# Patient Record
Sex: Female | Born: 1955 | Race: White | Hispanic: No | Marital: Married | Smoking: Never smoker
Health system: Southern US, Community
[De-identification: ages and names within clinical notes are randomized; demographics above are authoritative.]

## PROBLEM LIST (undated history)

## (undated) DIAGNOSIS — N289 Disorder of kidney and ureter, unspecified: Secondary | ICD-10-CM

## (undated) DIAGNOSIS — J3489 Other specified disorders of nose and nasal sinuses: Secondary | ICD-10-CM

---

## 1998-06-30 ENCOUNTER — Other Ambulatory Visit: Admission: RE | Admit: 1998-06-30 | Discharge: 1998-06-30 | Payer: Self-pay

## 1998-11-23 ENCOUNTER — Ambulatory Visit (HOSPITAL_COMMUNITY): Admission: RE | Admit: 1998-11-23 | Discharge: 1998-11-23 | Payer: Self-pay | Admitting: Urology

## 1998-11-23 ENCOUNTER — Emergency Department (HOSPITAL_COMMUNITY): Admission: EM | Admit: 1998-11-23 | Discharge: 1998-11-23 | Payer: Self-pay | Admitting: Emergency Medicine

## 1999-07-30 ENCOUNTER — Other Ambulatory Visit: Admission: RE | Admit: 1999-07-30 | Discharge: 1999-07-30 | Payer: Self-pay | Admitting: Obstetrics and Gynecology

## 2000-06-24 ENCOUNTER — Other Ambulatory Visit: Admission: RE | Admit: 2000-06-24 | Discharge: 2000-06-24 | Payer: Self-pay | Admitting: Obstetrics and Gynecology

## 2001-08-21 ENCOUNTER — Other Ambulatory Visit: Admission: RE | Admit: 2001-08-21 | Discharge: 2001-08-21 | Payer: Self-pay | Admitting: Family Medicine

## 2002-11-15 ENCOUNTER — Ambulatory Visit (HOSPITAL_BASED_OUTPATIENT_CLINIC_OR_DEPARTMENT_OTHER): Admission: RE | Admit: 2002-11-15 | Discharge: 2002-11-15 | Payer: Self-pay | Admitting: Urology

## 2002-11-15 ENCOUNTER — Encounter: Payer: Self-pay | Admitting: Urology

## 2002-12-15 ENCOUNTER — Other Ambulatory Visit: Admission: RE | Admit: 2002-12-15 | Discharge: 2002-12-15 | Payer: Self-pay | Admitting: Family Medicine

## 2003-12-23 ENCOUNTER — Other Ambulatory Visit: Admission: RE | Admit: 2003-12-23 | Discharge: 2003-12-23 | Payer: Self-pay | Admitting: Family Medicine

## 2004-12-25 ENCOUNTER — Other Ambulatory Visit: Admission: RE | Admit: 2004-12-25 | Discharge: 2004-12-25 | Payer: Self-pay | Admitting: Family Medicine

## 2006-01-03 ENCOUNTER — Other Ambulatory Visit: Admission: RE | Admit: 2006-01-03 | Discharge: 2006-01-03 | Payer: Self-pay | Admitting: Family Medicine

## 2007-01-06 ENCOUNTER — Other Ambulatory Visit: Admission: RE | Admit: 2007-01-06 | Discharge: 2007-01-06 | Payer: Self-pay | Admitting: Family Medicine

## 2008-01-15 ENCOUNTER — Other Ambulatory Visit: Admission: RE | Admit: 2008-01-15 | Discharge: 2008-01-15 | Payer: Self-pay | Admitting: Family Medicine

## 2008-02-15 ENCOUNTER — Ambulatory Visit (HOSPITAL_COMMUNITY): Admission: RE | Admit: 2008-02-15 | Discharge: 2008-02-15 | Payer: Self-pay | Admitting: Urology

## 2009-05-01 ENCOUNTER — Encounter: Admission: RE | Admit: 2009-05-01 | Discharge: 2009-05-01 | Payer: Self-pay | Admitting: Obstetrics and Gynecology

## 2010-02-16 ENCOUNTER — Encounter: Payer: Self-pay | Admitting: Pulmonary Disease

## 2010-02-16 ENCOUNTER — Ambulatory Visit: Payer: Self-pay | Admitting: Critical Care Medicine

## 2010-02-16 ENCOUNTER — Telehealth (INDEPENDENT_AMBULATORY_CARE_PROVIDER_SITE_OTHER): Payer: Self-pay | Admitting: *Deleted

## 2010-02-16 DIAGNOSIS — R0609 Other forms of dyspnea: Secondary | ICD-10-CM

## 2010-02-16 DIAGNOSIS — J301 Allergic rhinitis due to pollen: Secondary | ICD-10-CM | POA: Insufficient documentation

## 2010-02-16 DIAGNOSIS — R0989 Other specified symptoms and signs involving the circulatory and respiratory systems: Secondary | ICD-10-CM | POA: Insufficient documentation

## 2010-03-02 ENCOUNTER — Ambulatory Visit: Payer: Self-pay | Admitting: Critical Care Medicine

## 2010-03-05 ENCOUNTER — Encounter: Payer: Self-pay | Admitting: Critical Care Medicine

## 2010-03-06 ENCOUNTER — Telehealth: Payer: Self-pay | Admitting: Critical Care Medicine

## 2010-04-13 ENCOUNTER — Telehealth: Payer: Self-pay | Admitting: Critical Care Medicine

## 2010-04-20 ENCOUNTER — Ambulatory Visit: Payer: Self-pay | Admitting: Critical Care Medicine

## 2010-04-20 ENCOUNTER — Telehealth: Payer: Self-pay | Admitting: Critical Care Medicine

## 2010-04-23 ENCOUNTER — Ambulatory Visit: Payer: Self-pay | Admitting: Adult Health

## 2010-04-24 ENCOUNTER — Ambulatory Visit (HOSPITAL_COMMUNITY): Admission: RE | Admit: 2010-04-24 | Discharge: 2010-04-24 | Payer: Self-pay | Admitting: Critical Care Medicine

## 2010-04-24 ENCOUNTER — Encounter: Payer: Self-pay | Admitting: Critical Care Medicine

## 2010-04-24 ENCOUNTER — Telehealth (INDEPENDENT_AMBULATORY_CARE_PROVIDER_SITE_OTHER): Payer: Self-pay | Admitting: *Deleted

## 2010-04-24 LAB — CONVERTED CEMR LAB
BUN: 20 mg/dL (ref 6–23)
Basophils Absolute: 0 10*3/uL (ref 0.0–0.1)
Basophils Relative: 0.8 % (ref 0.0–3.0)
CO2: 31 meq/L (ref 19–32)
Calcium: 9.6 mg/dL (ref 8.4–10.5)
Chloride: 102 meq/L (ref 96–112)
Creatinine, Ser: 0.8 mg/dL (ref 0.4–1.2)
Eosinophils Absolute: 0.1 10*3/uL (ref 0.0–0.7)
Eosinophils Relative: 2.5 % (ref 0.0–5.0)
GFR calc non Af Amer: 85.24 mL/min (ref >60)
Glucose, Bld: 93 mg/dL (ref 70–99)
HCT: 40 % (ref 36.0–46.0)
Hemoglobin: 14 g/dL (ref 12.0–15.0)
Lymphocytes Relative: 22.6 % (ref 12.0–46.0)
Lymphs Abs: 1.3 10*3/uL (ref 0.7–4.0)
MCHC: 35 g/dL (ref 30.0–36.0)
MCV: 94.4 fL (ref 78.0–100.0)
Monocytes Absolute: 0.7 10*3/uL (ref 0.1–1.0)
Monocytes Relative: 11.5 % (ref 3.0–12.0)
Neutro Abs: 3.6 10*3/uL (ref 1.4–7.7)
Neutrophils Relative %: 62.6 % (ref 43.0–77.0)
Platelets: 219 10*3/uL (ref 150.0–400.0)
Potassium: 4.4 meq/L (ref 3.5–5.1)
Pro B Natriuretic peptide (BNP): 48.2 pg/mL (ref 0.0–100.0)
RBC: 4.23 M/uL (ref 3.87–5.11)
RDW: 12.6 % (ref 11.5–14.6)
Sodium: 140 meq/L (ref 135–145)
WBC: 5.7 10*3/uL (ref 4.5–10.5)

## 2010-04-27 ENCOUNTER — Ambulatory Visit: Payer: Self-pay | Admitting: Internal Medicine

## 2010-05-01 ENCOUNTER — Telehealth (INDEPENDENT_AMBULATORY_CARE_PROVIDER_SITE_OTHER): Payer: Self-pay | Admitting: *Deleted

## 2010-05-01 ENCOUNTER — Ambulatory Visit: Payer: Self-pay | Admitting: Critical Care Medicine

## 2010-05-01 DIAGNOSIS — J018 Other acute sinusitis: Secondary | ICD-10-CM | POA: Insufficient documentation

## 2010-05-02 ENCOUNTER — Encounter: Payer: Self-pay | Admitting: Critical Care Medicine

## 2010-05-14 ENCOUNTER — Telehealth (INDEPENDENT_AMBULATORY_CARE_PROVIDER_SITE_OTHER): Payer: Self-pay | Admitting: *Deleted

## 2010-06-16 ENCOUNTER — Emergency Department (HOSPITAL_COMMUNITY): Admission: EM | Admit: 2010-06-16 | Discharge: 2010-06-16 | Payer: Self-pay | Admitting: Emergency Medicine

## 2010-08-08 ENCOUNTER — Encounter: Payer: Self-pay | Admitting: Critical Care Medicine

## 2010-12-31 ENCOUNTER — Encounter: Payer: Self-pay | Admitting: Obstetrics and Gynecology

## 2011-01-08 NOTE — Assessment & Plan Note (Signed)
Summary: Acute NP office visit - dyspnea   Copy to:  Dr. Marcelle Overlie Primary Provider/Referring Provider:  Dr. Marcelle Overlie  CC:  increased SOB, wheezing, dry cough, tightness in chest x1week, was given pred taper but symptoms have not improved.  states symptoms began shortly after having dental work, and and c/o some pain in right side of chest at breast area ?d/t weight lifting.Vanessa Diaz  History of Present Illness:  55  year old female who presented for initial pulmonary consult for dyspnea on 02/16/10  02/16/10--The patient complains of shortness of breath, but denies history of diagnosed COPD, chest tightness, chest pain worse with breathing and coughing, wheezing, cough, mucous production, nocturnal awakening, exercise induced symptoms, and congestion.  The cough is described as insignificant.  The dyspnea is described as dyspnea with exertion.  .  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  If the pt inhales the pt feels she cannot enough air.  There is occ cough dry.  No real  wheeze.  No dx of asthma.  Pt had sinus surgery 12/10.  Pt smoked minimally and quit 1998.  There was passive smoke exporsure as a child.    04/20/10--Returns for persistent symptoms of dyspnea. She complains of increased SOB, wheezing, dry cough, tightness in chest for 1 week.  She was called in pred taper 1 week ago, now fnishted w/o significant improvment. Symptoms began shortly after having dental work, and c/o some pain in right side of chest at breast area ?d/t weight lifting. She has had some nasal congestion, drainage and stuffiness. On average she exercises at moderate levels 4-5 x week, spin/eights, and treadmil. 30 min cardio. can still exercise, main trouble is w/ incline. She does not feel she can take in good deep breath. Last visit was given trial of Spiriva. Since last visit she did improve and returned to her baseline acitivities and exercise schedule until  2 weeks (right after her dental work). She denies exertional chest pain, jaw pain, presyncope/syncope, calf pain/swelling or redness or orthopnea. Prev PFT this year showed FEV1 at 116% and ratio of 75. She is undergoing CPST next week. 6 min walk w/ no desaturations and  HR in 50-60s.     Medications Prior to Update: 1)  Biotin 5000 Mcg Caps (Biotin) .... Once Daily 2)  Vitamin D3 2000 Unit Caps (Cholecalciferol) .... Once Daily 3)  Fish Oil 1000 Mg Caps (Omega-3 Fatty Acids) .... Once Daily 4)  Multivitamins  Tabs (Multiple Vitamin) .... Once Daily 5)  Glucosamine 1500 Complex  Caps (Glucosamine-Chondroit-Vit C-Mn) .... Once Daily 6)  Calcium 600 1500 Mg Tabs (Calcium Carbonate) .... Two Times A Day 7)  Progesterone 2% Cream .... At Bedtime 8)  Estriol 0.25 % .Vanessa Diaz.. 1ml Three Times Weekly 9)  Spiriva Handihaler 18 Mcg  Caps (Tiotropium Bromide Monohydrate) .... Two Puffs in Handihaler Daily 10)  Prednisone 10 Mg Tabs (Prednisone) .... Take 4 Tabs  X 2 Days, 3 Tabs X 2 Tabs Days, 2 Tabs  X 2 Days, 1 Tab  X 2 Days Then Stop  Current Medications (verified): 1)  Biotin 5000 Mcg Caps (Biotin) .... Once Daily 2)  Vitamin D3 2000 Unit Caps (Cholecalciferol) .... Once Daily 3)  Fish Oil 1000 Mg Caps (Omega-3 Fatty Acids) .... Once Daily 4)  Multivitamins  Tabs (Multiple Vitamin) .... Once Daily 5)  Glucosamine 1500 Complex  Caps (Glucosamine-Chondroit-Vit C-Mn) .... Once Daily 6)  Calcium  600 1500 Mg Tabs (Calcium Carbonate) .... Two Times A Day 7)  Progesterone 2% Cream .... At Bedtime 8)  Estriol 0.25 % .Vanessa Diaz.. 1ml Three Times Weekly 9)  Spiriva Handihaler 18 Mcg  Caps (Tiotropium Bromide Monohydrate) .... Two Puffs in Handihaler Daily 10)  Amoxicillin 500 Mg Caps (Amoxicillin) .... 2 Intially Then 1 Every 6 Hours Till Gone As Directed By Dentist - Will Finished 04-21-10 11)  Testosterone Subl 0.25mg  .... 3 Times A Week As Directed By Dr. Vincente Poli  Allergies (verified): No Known Drug  Allergies  Past History:  Past Surgical History: Last updated: 02/16/2010 sinus surgery 11/2010  Family History: Last updated: 02/16/2010 father-lymphoma brother with asthma  Social History: Last updated: 02/16/2010 Patient states former smoker.  Quit in 1998 <1ppd x9 years. 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  --CPST>> --nml cbc, bmet, bnp 04/20/10    Vital Signs:  Patient profile:   55 year old female Height:      68 inches Weight:      138 pounds BMI:     21.06 O2 Sat:      98 % on Room air Temp:     97.9 degrees F oral Pulse rate:   67 / minute BP sitting:   122 / 84  (left arm) Cuff size:   regular  Vitals Entered By: Boone Master CNA (Apr 20, 2010 10:54 AM)  O2 Flow:  Room air CC: increased SOB, wheezing, dry cough, tightness in chest x1week, was given pred taper but symptoms have not improved.  states symptoms began shortly after having dental work, and c/o some pain in right side of chest at breast area ?d/t weight lifting. Is Patient Diabetic? No Comments Medications reviewed with patient Daytime contact number verified with patient. Boone Master CNA  Apr 20, 2010 10:56 AM    Physical Exam  Additional Exam:  Gen: Pleasant, well-nourished, in no distress,  normal affect ENT: No lesions,  mouth clear,  oropharynx clear, NM red/swollen, clear discharge  Neck: No JVD, no TMG, no carotid bruits Lungs: No use of accessory muscles, no dullness to percussion, clear without rales or rhonchi, distant BS Cardiovascular: RRR, heart sounds normal, no murmur or gallops, no peripheral edema Abdomen: soft and NT, no HSM,   BS normal Musculoskeletal: No deformities, no cyanosis or clubbing Neuro: alert, non focal Skin: Warm, no lesions or rashes    Impression & Recommendations:  Problem # 1:  DYSPNEA ON EXERTION (ICD-786.09) Main complaint is she has flare of dyspnea w/ incline. She is quite active w/ moderate exercise capacity at baseline.  PFTs showed nml lung fxn. No desaturations w/ 6 min walk and good tolearnce/nml heartrate labs are unrevealing w/ nml BNP, CBC . CXR showed mild hyperinflation.  will look at CPST when available.  ? etiology, may have component exercise induced asthma  Initially symptoms flared after sinusiits, and now has mild rhinitis flare (no sign of active infection) For now will tx rhinitis flare and follow up on CPST. Hold oil based capsules to prevent upper airway irritation.   She has follow up in 2 weeks w/ Dr. Delford Field  Will discuss case further with him.  Her updated medication  list for this problem includes:    Spiriva Handihaler 18 Mcg Caps (Tiotropium bromide monohydrate) .Vanessa Diaz..Vanessa Diaz Two puffs in handihaler daily  Orders: TLB-CBC Platelet - w/Differential (85025-CBCD) TLB-BMP (Basic Metabolic Panel-BMET) (80048-METABOL) TLB-BNP (B-Natriuretic Peptide) (83880-BNPR) Est. Patient Level IV (81191)  Medications Added to Medication List This Visit: 1)  Amoxicillin 500 Mg Caps (Amoxicillin) .... 2 intially then 1 every 6 hours till gone as directed by dentist - will finished 04-21-10 2)  Testosterone Subl 0.25mg   .... 3 times a week as directed by dr. Vincente Poli  Complete Medication List: 1)  Biotin 5000 Mcg Caps (Biotin) .... Once daily 2)  Vitamin D3 2000 Unit Caps (Cholecalciferol) .... Once daily 3)  Fish Oil 1000 Mg Caps (Omega-3 fatty acids) .... Once daily 4)  Multivitamins Tabs (Multiple vitamin) .... Once daily 5)  Glucosamine 1500 Complex Caps (Glucosamine-chondroit-vit c-mn) .... Once daily 6)  Calcium 600 1500 Mg Tabs (Calcium carbonate) .... Two times a day 7)   Progesterone 2% Cream  .... At bedtime 8)  Estriol 0.25 %  .Vanessa Diaz.. 1ml three times weekly 9)  Spiriva Handihaler 18 Mcg Caps (Tiotropium bromide monohydrate) .... Two puffs in handihaler daily 10)  Amoxicillin 500 Mg Caps (Amoxicillin) .... 2 intially then 1 every 6 hours till gone as directed by dentist - will finished 04-21-10 11)  Testosterone Subl 0.25mg   .... 3 times a week as directed by dr. Vincente Poli  Patient Instructions: 1)  Hold fish oil and vitamin D for now.  2)  Claritin 10mg  at bedtime  3)  Saline nasal rinses as needed  4)  Increase fluids.  5)  follow up for CPST test next week.  6)  Please contact office for sooner follow up if symptoms do not improve or worsen  7)  follow up.Dr. Delford Field in 2 weeks. and as needed    Immunization History:  Influenza Immunization History:    Influenza:  historical (12/09/2009)   Appended Document: Acute NP office visit - dyspnea I agree with this evaluation  pw

## 2011-01-08 NOTE — Letter (Signed)
Summary: CPST- R/O Contraindications   [facility]  [address]   [place], [place] [address]   Phone: [phone]  Fax: [phone]    Patient's Name: Vanessa Diaz Date of Birth: [birth year] MRN: [id number]  *********Rule out Contraindications**************** Absolute                                                                                                                           ___ Acute MI (3-5 Days)                                 ___ Unstable Angina                                          ___ Uncontrolled arrhythmias causing symptoms or hemodynamic compromise. ___ Syncope                                                     ___ Active endocarditis                                         ___ Acute Myocarditis/Pericarditis                        ___ Symptomatic severe aortic stenosis  ___ Acute Pulmonary embolus or pulmonary infarction                ___ Uncontrolled Heart Failure  ___ Thrombosis of lower extremitie ___ Suspected dissecting aneurysm ___ Uncontrolled Asthma                          ___ Pulmonary Edema                                        ___ RA desat @ rest<85%                                      ___ Repiratory Failure                                         ___ Acute noncardiopulmonary disorder that may affect exercise performance or be         aggravated by exercise (ie infection, renal failure,  thyrotoxicosis) .                               ___ Mental impairment leading to inabliity to cooperate   Relative ___ Left main coronary stenosis or its equivalent ___ Moderate stentoic valvular heart disease ___ Severe untreated arterial hypertension @ rest (<200 mmHg             99991111 Diastolic ___ Tachy/Brady Arrhythmias ___ High- degree artioventricular block ___ Hypertrophic cardiomyopathy ___ Significant pulmonary hypertension ___ Advanced or complicated pregnancy ___ Electrolyte abnormalities ___ Orthopedic impairment that  compromises exercise performance    [facility]

## 2011-01-08 NOTE — Letter (Signed)
Summary: [facility]  [facility]   Imported By: Lester Georgetown 08/21/2010 08:58:41  _____________________________________________________________________  External Attachment:    Type:   Image     Comment:   External Document

## 2011-01-08 NOTE — Assessment & Plan Note (Signed)
Summary: Pulmonary OV   Copy to:  Dr. Marcelle Overlie Primary Provider/Referring Provider:  Dr. Marcelle Overlie  CC:  Followup to discuss stress test results.  She states that her breathing has been doing better.  No complaints today.Vanessa Diaz  History of Present Illness:  55  year old female who presented for initial pulmonary consult for dyspnea on 02/16/10    May 01, 2010 11:20 AM Pt notes not much drainage now. Pt  was inflammed on the L side  of face and had sinusitis. Needs implants on L upper side of teeth.  Per Dr Chales Salmon.   Pt with prior sinusitis 12/10.  ? if sinuses are the source of her dypsnea.  CPST performed and was normal.  Pt is less dyspneic on Spiriva. No other symptoms.  STill with pn drip and sinus congestion.   spriva did help last week rainy and damp and was symptomatic this week is better   Preventive Screening-Counseling & Management  Alcohol-Tobacco     Smoking Status: quit > 6 months  Current Medications (verified): 1)  Biotin 5000 Mcg Caps (Biotin) .... Once Daily 2)  Multivitamins  Tabs (Multiple Vitamin) .... Once Daily 3)  Glucosamine 1500 Complex  Caps (Glucosamine-Chondroit-Vit C-Mn) .... Once Daily 4)  Calcium 600 1500 Mg Tabs (Calcium Carbonate) .... Two Times A Day 5)  Progesterone 2% Cream .... At Bedtime 6)  Estriol 0.25 % .Vanessa Diaz.. 1ml Three Times Weekly 7)  Spiriva Handihaler 18 Mcg  Caps (Tiotropium Bromide Monohydrate) .... Two Puffs in Handihaler Daily 8)  Testosterone Subl 0.25mg  .... 3 Times A Week As Directed By Dr. Vincente Poli  Allergies (verified): No Known Drug Allergies  Past History:  Past medical, surgical, family and social histories (including risk factors) reviewed, and no changes noted (except as noted below).  Past Medical History: ALLERGIC RHINITIS, SEASONAL (ICD-477.0) Hx of renal stones and s/p lithotripsy Dyspnea--min smoking hx  --iniital pulm eval 02/16/10 --6 min walk- no desats, HR 50-60s --PFTs 02/2010>Prev FEV1 at 116% and  ratio of 75.  --CPST>>normal, no EIA --nml cbc, bmet, bnp 04/20/10     Past Surgical History: Reviewed history from 02/16/2010 and no changes required. sinus surgery 11/2010  Family History: Reviewed history from 02/16/2010 and no changes required. father-lymphoma brother with asthma  Social History: Reviewed history from 02/16/2010 and no changes required. Patient states former smoker.  Quit in 1998 <1ppd x9 years. 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  The patient denies shortness of breath with activity, shortness of breath at rest, productive cough, non-productive cough, coughing up blood, chest pain, irregular heartbeats, acid heartburn, indigestion, loss of appetite, weight change, abdominal pain, difficulty swallowing, sore throat, tooth/dental problems, headaches, sneezing, itching, ear ache, anxiety, depression, hand/feet swelling, joint stiffness or pain, rash, change in color of mucus, and fever.    Vital Signs:  Patient profile:   55 year old female Weight:      137.31 pounds O2 Sat:      98 % on Room air Temp:     98.5 degrees F oral Pulse rate:   65 / minute BP sitting:   112 / 70  (left arm)  Vitals Entered By: Vernie Murders (May 01, 2010 11:08 AM)  O2 Flow:  Room air  Physical Exam  Additional Exam:  Gen:  Pleasant, well-nourished, in no distress,  normal affect ENT: No lesions,  mouth clear,  oropharynx clear, NM red/swollen, purulent  discharge from L nares Neck: No JVD, no TMG, no carotid bruits Lungs: No use of accessory muscles, no dullness to percussion, clear without rales or rhonchi, distant BS Cardiovascular: RRR, heart sounds normal, no murmur or gallops, no peripheral edema Abdomen: soft and NT, no HSM,  BS normal Musculoskeletal: No deformities, no  cyanosis or clubbing Neuro: alert, non focal Skin: Warm, no lesions or rashes    Impression & Recommendations:  Problem # 1:  OTHER ACUTE SINUSITIS (ICD-461.8) Assessment Deteriorated Acute sinusitis on L maxillary.  Need to delay oral surgery.  Needs ENT input plan augmentin for 7days nasal hygiene case discussed with dr Chales Salmon,  he agrees to postpone surgery and will obtain ENT eval The following medications were removed from the medication list:    Amoxicillin 500 Mg Caps (Amoxicillin) .Vanessa Diaz... 2 intially then 1 every 6 hours till gone as directed by dentist - will finished 04-21-10 Her updated medication list for this problem includes:    Amoxicillin-pot Clavulanate 875-125 Mg Tabs (Amoxicillin-pot clavulanate) .Vanessa Diaz... Take one by mouth two times a day    Fluticasone Propionate 50 Mcg/act Susp (Fluticasone propionate) .Vanessa Diaz..Vanessa Diaz Two sprays each nostril daily  Orders: Est. Patient Level IV (16109) Prescription Created Electronically [phone])  Problem # 2:  DYSPNEA ON EXERTION (ICD-786.09) Assessment: Improved Normal CPST, no EIA on CPST,  suspect some degree of airway disease despite normal pfts with hx of smoking, sinusitis and hyperaeration on CXR plan cont spiriva for now with notion of d/c of spiriva in future Her updated medication list for this problem includes:    Spiriva Handihaler 18 Mcg Caps (Tiotropium bromide monohydrate) .Vanessa Diaz..Vanessa Diaz Two puffs in handihaler daily  Medications Added to Medication List This Visit: 1)  Amoxicillin-pot Clavulanate 875-125 Mg Tabs (Amoxicillin-pot clavulanate) .... Take one by mouth two times a day 2)  Fluticasone Propionate 50 Mcg/act Susp (Fluticasone propionate) .... Two sprays each nostril daily  Complete Medication List: 1)  Biotin 5000 Mcg Caps (Biotin) .... Once daily 2)  Multivitamins Tabs (Multiple vitamin) .... Once daily 3)  Glucosamine 1500 Complex Caps (Glucosamine-chondroit-vit c-mn) .... Once daily 4)  Calcium 600 1500 Mg Tabs (Calcium  carbonate) .... Two times a day 5)  Progesterone 2% Cream  .... At bedtime 6)  Estriol 0.25 %  .Vanessa Diaz.. 1ml three times weekly 7)  Spiriva Handihaler 18 Mcg Caps (Tiotropium bromide monohydrate) .... Two puffs in handihaler daily 8)  Testosterone Subl 0.25mg   .... 3 times a week as directed by dr. Vincente Poli 9)  Amoxicillin-pot Clavulanate 875-125 Mg Tabs (Amoxicillin-pot clavulanate) .... Take one by mouth two times a day 10)  Fluticasone Propionate 50 Mcg/act Susp (Fluticasone propionate) .... Two sprays each nostril daily  Patient Instructions: 1)  Stay on spiriva for now, we may be able to stop this if we can clear up the sinuses 2)  Start nasonex two sprays each nostril daily 3)  Start Augmentin one twice daily for 7days 4)  Start nasal wash: 5)  Use the NeilMed nasal rinse at least  twice daily washing out both nares thoroughly.  Place one packet of Sinus Wash ingredients into the nasal wash bottle then fill to the dotted line with lukewarm tap water.  Lean over the sink and rinse each nostril out thoroughly and avoid letting the rinse go into the throat.   6)  I will be in touch with Dr Chales Salmon,  we may want Dr Lazarus Salines to see you again 7)  I will phone you after I have discussed your case with Dr Chales Salmon 8)  It is ok to proceed with implants but I will call Dr Chales Salmon Prescriptions: FLUTICASONE PROPIONATE 50 MCG/ACT SUSP (FLUTICASONE PROPIONATE) Two sprays each nostril daily  #1 x 6   Entered and Authorized by:   Storm Frisk MD   Signed by:   Storm Frisk MD on 05/01/2010   Method used:   Electronically to        [facility]. [phone]* (retail)       [phone]       [place], [place]  [address]       Ph: [phone]       Fax: [phone]   RxID:   [id number] AMOXICILLIN-POT CLAVULANATE 875-125 MG TABS (AMOXICILLIN-POT CLAVULANATE) Take one by mouth two times a day  #14 x 0   Entered and Authorized by:   Storm Frisk MD   Signed by:    Storm Frisk MD on 05/01/2010   Method used:   Electronically to        [facility]. [phone]* (retail)       [phone] [address]       [place], [place]  [address]       Ph: [phone]       Fax: [phone]   RxID:   [id number]   Appended Document: Pulmonary OV fax Marcelle Overlie, Dutch Quint, Zola Button [facility]

## 2011-01-08 NOTE — Assessment & Plan Note (Signed)
Summary: SIX MIN WALK-PULM STRESS TEST  Nurse Visit   Vital Signs:  Patient profile:   55 year old female Pulse rate:   57 / minute BP sitting:   114 / 72  Medications Prior to Update: 1)  Biotin 5000 Mcg Caps (Biotin) .... Once Daily 2)  Vitamin D3 2000 Unit Caps (Cholecalciferol) .... Once Daily 3)  Fish Oil 1000 Mg Caps (Omega-3 Fatty Acids) .... Once Daily 4)  Multivitamins  Tabs (Multiple Vitamin) .... Once Daily 5)  Glucosamine 1500 Complex  Caps (Glucosamine-Chondroit-Vit C-Mn) .... Once Daily 6)  Calcium 600 1500 Mg Tabs (Calcium Carbonate) .... Two Times A Day 7)  Progesterone 2% Cream .... At Bedtime 8)  Estriol 0.25 % .Marland Kitchen.. 1ml Three Times Weekly 9)  Spiriva Handihaler 18 Mcg  Caps (Tiotropium Bromide Monohydrate) .... Two Puffs in Handihaler Daily  Allergies: No Known Drug Allergies  Orders Added: 1)  Pulmonary Stress (6 min walk) [94620]   Six Minute Walk Test Medications taken before test(dose and time): 1)  Biotin 5000 Mcg Caps (Biotin) .... Once Daily 2)  Vitamin D3 2000 Unit Caps (Cholecalciferol) .... Once Daily 3)  Fish Oil 1000 Mg Caps (Omega-3 Fatty Acids) .... Once Daily 4)  Multivitamins  Tabs (Multiple Vitamin) .... Once Daily 5)  Glucosamine 1500 Complex  Caps (Glucosamine-Chondroit-Vit C-Mn) .... Once Daily 6)  Calcium 600 1500 Mg Tabs (Calcium Carbonate) .... Two Times A Day 8)  Estriol 0.25 % .Marland Kitchen.. 1ml Three Times Weekly 9)  Spiriva Handihaler 18 Mcg  Caps (Tiotropium Bromide Monohydrate) .... Two Puffs in Handihaler Daily- Pt used at 9:00 am Pt took all these meds at 9:00 am today Supplemental oxygen during the test: No  Lap counter(place a tick mark inside a square for each lap completed) lap 1 complete  lap 2 complete   lap 3 complete   lap 4 complete  lap 5 complete  lap 6 complete  lap 7 complete   lap 8 complete   lap 9 complete  lap 10 complete  lap 11 complete   lap 12 complete   Baseline  BP sitting: 114/ 72 Heart rate:  57 Dyspnea ( Borg scale) 0 Fatigue (Borg scale) 0 SPO2 100  End Of Test  BP sitting: 110/ 70 Heart rate: 61 Dyspnea ( Borg scale) 0 Fatigue (Borg scale) 0 SPO2 99  2 Minutes post  BP sitting: 112/ 70 Heart rate: 49 SPO2 98  Stopped or paused before six minutes? No  Interpretation: Number of laps  13 X 48 meters =   624 meters =    624 meters   Total distance walked in six minutes: 624 meters  Tech ID: Tivis Ringer, CNA (March 02, 2010 2:51 PM) Tech Comments Pt completed test w/ 0 rest breaks or 0 complaints.

## 2011-01-08 NOTE — Progress Notes (Signed)
Summary: STILL SICK AFTER FINISHING MEDS  Phone Note Call from Patient Call back at Home Phone [phone] Call back at [phone]   Caller: Patient Call For: Elizette Shek Summary of Call: PT FINISHED THE PREDISONE AND SHE STILL DOESNT FEEL ANY BETTER [facility] Initial call taken by: Lacinda Axon,  Apr 20, 2010 8:55 AM  Follow-up for Phone Call        pt finished prednisone and is still SOB. Per note from PW if no better after Pred. then needs OV. Pt sett o see TP at 10:45.Carron Curie CMA  Apr 20, 2010 9:00 AM'

## 2011-01-08 NOTE — Progress Notes (Signed)
Summary: TALK TO NURSE-LMTCB  Phone Note Call from Patient   Caller: Patient Call For: WRIGHT Summary of Call: PT HAVE QUESTIONS ABOUT CONTINUING ON MED AND OV Initial call taken by: Rickard Patience,  May 14, 2010 3:25 PM  Follow-up for Phone Call        Spoke with pt.  She states that she was seen by ENT and had cx of her sinuses with was positive for bacteria.  She was started on cipro and has improved.  She states that she is not having any problems with her breathing and wants to know if needs to even sched a followup appt here.  She states that she does not feel like she needs to stay on the spiriva.  Wants to know what PW thinks the next strp should be.  Advised that PW out of the office until 05/21/10 and she states that it is fine to wait until then to have msg addressed.  Will forward msg.  Please advise, thanks! Follow-up by: Vernie Murders,  May 14, 2010 3:34 PM  Additional Follow-up for Phone Call Additional follow up Details #1::        ok to stop spiriva and f/u with pulmonary prn Additional Follow-up by: Storm Frisk MD,  May 17, 2010 1:26 PM    Additional Follow-up for Phone Call Additional follow up Details #2::    LMTCB. Carron Curie CMA  May 17, 2010 1:43 PM  pt aware of pw's response and is fine with this she will call for appt if needed  Follow-up by: Philipp Deputy CMA,  May 17, 2010 5:25 PM

## 2011-01-08 NOTE — Progress Notes (Signed)
Summary: meds problem  Phone Note Call from Patient Call back at [phone] after 10 or after 11:30   Caller: pt Call For: Kaidynce Pfister Summary of Call: pt has been taken spiriva and it working well but not she is having some issues with breathing and a cough Initial call taken by: Lacinda Axon,  Apr 13, 2010 8:22 AM  Follow-up for Phone Call        pt states she was started on spiriva on 3/11 ov and was doing well, her SOB was better. Now x 3 days she has had increased SOB and has developed a dry cough. She has not had CPST yet, she has r/s it for 2 weeks from now. Please advise.Carron Curie CMA  Apr 13, 2010 9:43 AM   Additional Follow-up for Phone Call Additional follow up Details #1::        call in prednisone 10mg  Take 4 daily for two days, then 3 daily for two days, then two daily for two days then one daily for two days then stop  OV if not improved after prednisone stay on spiriva  get cpst done Additional Follow-up by: Storm Frisk MD,  Apr 13, 2010 1:01 PM    Additional Follow-up for Phone Call Additional follow up Details #2::    called and spoke with pt and she is aware of PW recs---she will try the prednisone taper and if not better will call for ov.  she will cont the spiriva and she is doing the cpst in 2 weeks and will keep this appt. Randell Loop CMA  Apr 13, 2010 1:55 PM   New/Updated Medications: PREDNISONE 10 MG TABS (PREDNISONE) take 4 tabs  x 2 days, 3 tabs x 2 tabs days, 2 tabs  x 2 days, 1 tab  x 2 days then stop Prescriptions: PREDNISONE 10 MG TABS (PREDNISONE) take 4 tabs  x 2 days, 3 tabs x 2 tabs days, 2 tabs  x 2 days, 1 tab  x 2 days then stop  #20 x 0   Entered by:   Randell Loop CMA   Authorized by:   Storm Frisk MD   Signed by:   Randell Loop CMA on 04/13/2010   Method used:   Electronically to        [facility]. [phone]* (retail)       [phone] [address]       [place], [place]  [address]       Ph:  [phone]       Fax: [phone]   RxID:   [id number]

## 2011-01-08 NOTE — Miscellaneous (Signed)
Summary: Orders Update pft charges  Clinical Lists Changes  Orders: Added new Service order of Carbon Monoxide diffusing w/capacity (94720) - Signed Added new Service order of Lung Volumes (94240) - Signed Added new Service order of Spirometry (Pre & Post) (94060) - Signed 

## 2011-01-08 NOTE — Letter (Signed)
Summary: CPST [facility]  [address]   [place], [place] [address]   Phone: [phone]  Fax: [phone]     Patient's Name: Vanessa Diaz Date of Birth: 06/15/1956 MRN: [id number]  CPST  Choose test method and choice  a)___Bike - recommended by ATS/ACCP. Do at [facility] at Dr. Gala Romney [facility]  b)___Treadmill - less preferred. Do at [facility] at Dr. Gala Romney lab or do at [facility] PFT lab  Choose one or more indication for test  INDICATIONS FOR CARDIOPULMONARY EXERCISE TESTING Evaluation of exercise tolerance ______ Determination of functional impairment or capacity (peak V? O2) ______ Determination of exercise-limiting factors and pathophysiologic mechanisms  Evaluation of undiagnosed exercise intolerance _____ Assessing contribution of cardiac and pulmonary etiology in coexisting disease _____ Symptoms disproportionate to resting pulmonary and cardiac tests  _____Unexplained dyspnea when initial cardiopulmonary testing is nondiagnostic  Evaluation of patients with cardiovascular disease _____ Functional evaluation and prognosis in patients with heart failure _____ Selection for cardiac transplantation _____ Exercise prescription and monitoring response to exercise training for cardiac rehabilitation (special circumstances; i.e., pacemakers)  Evaluation of patients with respiratory disease _____ Functional impairment assessment (see specific clinical applications)  _____Chronic obstructive pulmonary disease Establishing exercise limitation(s) and assessing other potential contributing factors, especially occult heart disease (ischemia) ______Determination of magnitude of hypoxemia and for O2 prescription When objective determination of therapeutic intervention is necessary and not adequately addressed by standard pulmonary function testing  _____ Interstitial lung diseases _____Detection of early (occult) gas exchange abnormalities _____Overall  assessment/monitoring of pulmonary gas exchange _____Determination of magnitude of hypoxemia and for O2 prescription _____Determination of potential exercise-limiting factors _____Documentation of therapeutic response to potentially toxic therapy  ____ Pulmonary vascular disease (careful risk-benefit analysis required)  ____ Cystic fibrosis  ____ Exercise-induced bronchospasm  Specific clinical applications ____  Preoperative evaluation _____Lung resectional surgery _____Elderly patients undergoing major abdominal surgery _____Lung volume resectional surgery for emphysema (currently investigational)  ____ Exercise evaluation and prescription for pulmonary rehabilitation  ____ Evaluation for impairment-disability  ____ Evaluation for lung, heart-lung transplantation ____ Definition of abbreviation: V? O2______ -oxygen consumption.    [facility]    [facility]

## 2011-01-08 NOTE — Progress Notes (Signed)
Summary: infection in sinus  Phone Note Call from Patient Call back at Work Phone [phone]   Caller: pt Call For: wright Summary of Call: pt really wants to resc dental surgery due to the infection Initial call taken by: Lacinda Axon,  May 01, 2010 12:15 PM  Follow-up for Phone Call        Per Dr Delford Field, this was discussed with pt's dentist and they decided that they want to postpone the surgery, but pt should still keep planned folliowup with him to discuss things.  I spoke with pt and made aware of this and to keep appt with dentist. Follow-up by: Vernie Murders,  May 01, 2010 12:26 PM

## 2011-01-08 NOTE — Progress Notes (Signed)
Summary: results  Phone Note Call from Patient Call back at Work Phone [phone]   Caller: Patient Call For: Parrett Reason for Call: Talk to Nurse, Lab or Test Results Summary of Call: returning call re: lab results Initial call taken by: Eugene Gavia,  Apr 24, 2010 8:13 AM  Follow-up for Phone Call        pt informed of lab results and recs as stated in append from 04/23/10 from TP.  She verbalized understanding.  Pt also requesting to schedule follow up appt next week with PW to discuss dental implants that she is scheduled to have.  OV scheduled for 05/01/10 at 1100 with PW - pt aware.   Follow-up by: Gweneth Dimitri RN,  Apr 24, 2010 8:54 AM

## 2011-01-08 NOTE — Progress Notes (Signed)
Summary: spiriva/ pharm calling  Phone Note From Pharmacy   Caller: [facility] [address] Call For: wright  Summary of Call: pharmacy has a question re: directions for spiriva. says they just received an electronic request. [phone] Initial call taken by: Tivis Ringer, CNA,  February 16, 2010 4:54 PM  Follow-up for Phone Call        Spoke with pharmacist and notified that rx should read inhale contents of 1 capsule daily. Follow-up by: Vernie Murders,  February 16, 2010 5:03 PM

## 2011-01-08 NOTE — Progress Notes (Signed)
Summary: questions about condition  Phone Note Call from Patient Call back at [phone]   Caller: Patient Call For: Dr. Delford Field Summary of Call: [phone] or home [phone] Pt called and stated that she heard back from Dr. Delford Field on her test (PFT and 6 min walk) and she forgot to ask him.  1) What causes air trapping? 2) Will it ever go away? and If this is something Chronic? 3) Will she have to be on inhalers the rest of her life. She stated that her stress test has been r/s by [facility] and she will not have this test tomorrow. Please call pt back. Thanks, Initial call taken by: Alfonso Ramus,  March 06, 2010 2:59 PM  Follow-up for Phone Call        pt forgot to ask you the above questions when she was speaking to you earlier. Please advise. Carron Curie CMA  March 06, 2010 3:04 PM   Additional Follow-up for Phone Call Additional follow up Details #1::        I will cal the pt personally Additional Follow-up by: Storm Frisk MD,  March 07, 2010 11:02 AM

## 2011-01-08 NOTE — Assessment & Plan Note (Signed)
Summary: Pulmonary Consultation   Copy to:  Dr. Marcelle Overlie Primary Provider/Referring Provider:  Dr. Marcelle Overlie  CC:  Pulmonary COnsult for SOB.   and dyspnea.  History of Present Illness: Pulmonary Consultation       This is a 55 year old female who presents with dyspnea.  The patient complains of shortness of breath, but denies history of diagnosed COPD, chest tightness, chest pain worse with breathing and coughing, wheezing, cough, mucous production, nocturnal awakening, exercise induced symptoms, and congestion.  The cough is described as insignificant.  The dyspnea is described as dyspnea with exertion.  The patient denies any history of asthma, allergic rhinitis, COPD, sleep disordered breathing, obstructive sleep apnea, heart disease, thyroid disease, anemia, collagen vascular disease, osteporosis, kyphoscoliosis, occupational exposure: , and cancer: .    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  If the pt inhales the pt feels she cannot enough air.  There is occ cough dry.  No real  wheeze.  No dx of asthma.  Pt had sinus surgery 12/10.  Pt smoked minimally and quit 1998.  There was passive smoke exporsure as a child.    Pt denies any significant sore throat, nasal congestion or excess secretions, fever, chills, sweats, unintended weight loss, pleurtic or exertional chest pain, orthopnea PND, or leg swelling Pt denies any increase in rescue therapy over baseline, denies waking up needing it or having any early am or nocturnal exacerbations of coughing/wheezing/or dyspnea.   Preventive Screening-Counseling & Management  Alcohol-Tobacco     Smoking Status: quit  Current Medications (verified): 1)  Biotin 5000 Mcg Caps (Biotin) .... Once Daily 2)  Vitamin D3 2000 Unit Caps (Cholecalciferol) .... Once Daily 3)  Fish Oil 1000 Mg Caps (Omega-3 Fatty Acids) .... Once Daily 4)  Multivitamins  Tabs (Multiple Vitamin) ....  Once Daily 5)  Glucosamine 1500 Complex  Caps (Glucosamine-Chondroit-Vit C-Mn) .... Once Daily 6)  Calcium 600 1500 Mg Tabs (Calcium Carbonate) .... Two Times A Day 7)  Progesterone 2% Cream .... At Bedtime 8)  Estriol 0.25 % .Marland Kitchen.. 1ml Three Times Weekly  Allergies (verified): No Known Drug Allergies  Past History:  Past medical, surgical, family and social histories (including risk factors) reviewed, and no changes noted (except as noted below).  Past Medical History: ALLERGIC RHINITIS, SEASONAL (ICD-477.0) Hx of renal stones and s/p lithotripsy  Past Surgical History: sinus surgery 11/2010  Family History: Reviewed history and no changes required. father-lymphoma brother with asthma  Social History: Reviewed history and no changes required. Patient states former smoker.  Quit in 1998 <1ppd x9 years. 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  The patient denies shortness of breath at rest, productive cough, coughing up blood, chest pain, irregular heartbeats, acid heartburn, indigestion, loss of appetite, weight change, abdominal pain, difficulty swallowing, sore throat, headaches, nasal congestion/difficulty breathing through nose, sneezing, itching, ear ache, anxiety, depression, hand/feet swelling, joint stiffness or pain, rash, change in color of mucus, and fever.        See HPI for Pulmonary, Cardiac, General, and ENT review of systems.  Vital Signs:  Patient profile:   55 year old female Height:      68 inches Weight:  144 pounds BMI:     21.97 O2 Sat:      100 % on Room air Temp:     97.9 degrees F oral Pulse rate:   54 / minute BP sitting:   110 / 74  (left arm) Cuff size:   regular  Vitals Entered By: Renold Genta RCP, LPN (February 16, 2010 11:25  AM)  O2 Flow:  Room air CC: Pulmonary COnsult for SOB.  , dyspnea Comments Medications reviewed with patient Daytime contact number verified with patient. Gweneth Dimitri RN  February 16, 2010 11:27 AM    Physical Exam  Additional Exam:  Gen: Pleasant, well-nourished, in no distress,  normal affect ENT: No lesions,  mouth clear,  oropharynx clear, no postnasal drip Neck: No JVD, no TMG, no carotid bruits Lungs: No use of accessory muscles, no dullness to percussion, clear without rales or rhonchi, distant BS Cardiovascular: RRR, heart sounds normal, no murmur or gallops, no peripheral edema Abdomen: soft and NT, no HSM,  BS normal Musculoskeletal: No deformities, no cyanosis or clubbing Neuro: alert, non focal Skin: Warm, no lesions or rashes    CXR  Procedure date:  02/16/2010  Findings:      IMPRESSION: Mild hyperinflation but no acute pulmonary findings.  Pulmonary Function Test Date: 02/16/2010 11:48 AM Gender: Female  Pre-Spirometry FVC    Value: 3.53 L/min   % Pred: 89.90 % FEV1    Value: 2.73 L     Pred: 3.07 L     % Pred: 88.70 % FEV1/FVC  Value: 77.29 %     % Pred: 97.40 %  Impression & Recommendations:  Problem # 1:  DYSPNEA ON EXERTION (ICD-786.09) Assessment Unchanged Unclear etiology, air trapping on CXR however spirometry largely normal plan full pfts CPST with pre/post spirometry therapeutic trial spiriva Her updated medication list for this problem includes:    Spiriva Handihaler 18 Mcg Caps (Tiotropium bromide monohydrate) .Marland Kitchen..Marland Kitchen Two puffs in handihaler daily  Orders: Spirometry w/Graph (94010) New Patient Level V (69629) Pulmonary Referral (Pulmonary) T-2 View CXR (71020TC) Cardio-Pulmonary Stress Test Referral (Cardio-Pulmon)  Medications Added to Medication List This Visit: 1)  Biotin 5000 Mcg Caps (Biotin) .... Once daily 2)  Vitamin D3 2000 Unit Caps (Cholecalciferol) .... Once daily 3)  Fish Oil 1000 Mg Caps (Omega-3 fatty acids) ....  Once daily 4)  Multivitamins Tabs (Multiple vitamin) .... Once daily 5)  Glucosamine 1500 Complex Caps (Glucosamine-chondroit-vit c-mn) .... Once daily 6)  Calcium 600 1500 Mg Tabs (Calcium carbonate) .... Two times a day 7)  Progesterone 2% Cream  .... At bedtime 8)  Estriol 0.25 %  .Marland Kitchen.. 1ml three times weekly 9)  Spiriva Handihaler 18 Mcg Caps (Tiotropium bromide monohydrate) .... Two puffs in handihaler daily  Complete Medication List: 1)  Biotin 5000 Mcg Caps (Biotin) .... Once daily 2)  Vitamin D3 2000 Unit Caps (Cholecalciferol) .... Once daily 3)  Fish Oil 1000 Mg Caps (Omega-3 fatty acids) .... Once daily 4)  Multivitamins Tabs (Multiple vitamin) .... Once daily 5)  Glucosamine 1500 Complex Caps (Glucosamine-chondroit-vit c-mn) .... Once daily 6)  Calcium 600 1500 Mg Tabs (Calcium carbonate) .... Two times a day 7)  Progesterone 2% Cream  .... At bedtime 8)  Estriol 0.25 %  .Marland Kitchen.. 1ml three times weekly 9)  Spiriva Handihaler 18 Mcg Caps (Tiotropium bromide monohydrate) .... Two puffs in handihaler daily  Patient Instructions: 1)  A chest xray will be obtained 2)  A cardio-pulmonary stress test will be obtained 3)  I will call with the results Prescriptions: SPIRIVA HANDIHALER 18 MCG  CAPS (TIOTROPIUM BROMIDE MONOHYDRATE) Two puffs in handihaler daily Brand medically necessary #30 x 6   Entered and Authorized by:   Storm Frisk MD   Signed by:   Storm Frisk MD on 02/16/2010   Method used:   Electronically to        [facility]. [phone]* (retail)       [phone]       [place], [place]  [address]       Ph: [phone]       Fax: [phone]   RxID:   [id number]    [facility] Spirometry  ID: [id number] Patient: Vanessa Diaz, Vanessa Diaz DOB: 16-Sep-1956 Age: 55 Years Old Sex: Female Race: White Height: 68 Weight: 144 Status: Confirmed Recorded: 02/16/2010 11:48 AM  Parameter  Measured Predicted %Predicted FVC      3.53        3.92        89.90 FEV1     2.73        3.07        88.70 FEV1%   77.29        79.33        97.40 PEF    5.03        7.17        70.10   Comments: Normal spirometry  Interpretation: Pre: FVC= 3.53L FEV1= 2.73L FEV1%= 77.3% 2.73/3.53 FEV1/FVC (02/16/2010 11:51:13 AM), Within normal limits     Appended Document: Pulmonary Consultation fax Marcelle Overlie

## 2011-01-08 NOTE — Miscellaneous (Signed)
Summary: PFT   Pulmonary Function Test Date: 03/02/2010 Height (in.): 68 Gender: Female  Pre-Spirometry FVC    Value: 4.30 L/min   Pred: 3.67 L/min     % Pred: 117 % FEV1    Value: 3.21 L     Pred: 2.77 L     % Pred: 116 % FEV1/FVC  Value: 75 %     Pred: 74 %    FEF 25-75  Value: 2.62 L/min   Pred: 3.01 L/min     % Pred: 87 %  Post-Spirometry FVC    Value: 4.20 L/min   Pred: 3.67 L/min     % Pred: 114 % FEV1    Value: 3.21 L     Pred: 2.77 L     % Pred: 116 % FEV1/FVC  Value: 76 %     Pred: 74 %    FEF 25-75  Value: 2.84 L/min   Pred: 3.01 L/min     % Pred: 94 %  Lung Volumes TLC    Value: 7.53 L   % Pred: 131 % RV    Value: 3.22 L   % Pred: 152 % DLCO    Value: 20.1 %   % Pred: 93 % DLCO/VA  Value: 3.67 %   % Pred: 94 %  Comments: Normal spirometry, lung volumes and DLCO  Evaluation: normal Clinical Lists Changes  Observations: Added new observation of PFT COMMENTS: Normal spirometry, lung volumes and DLCO (03/05/2010 16:35) Added new observation of PFT RSLT: normal (03/05/2010 16:35) Added new observation of DLCO/VA%EXP: 94 % (03/05/2010 16:35) Added new observation of DLCO/VA: 3.67 % (03/05/2010 16:35) Added new observation of DLCO % EXPEC: 93 % (03/05/2010 16:35) Added new observation of DLCO: 20.1 % (03/05/2010 16:35) Added new observation of RV % EXPECT: 152 % (03/05/2010 16:35) Added new observation of RV: 3.22 L (03/05/2010 16:35) Added new observation of TLC % EXPECT: 131 % (03/05/2010 16:35) Added new observation of TLC: 7.53 L (03/05/2010 16:35) Added new observation of FEF2575%EXPS: 94 % (03/05/2010 16:35) Added new observation of PSTFEF25/75P: 3.01  (03/05/2010 16:35) Added new observation of PSTFEF25/75%: 2.84 L/min (03/05/2010 16:35) Added new observation of FEV1FVCPRDPS: 74 % (03/05/2010 16:35) Added new observation of PSTFEV1/FVC: 76 % (03/05/2010 16:35) Added new observation of POSTFEV1%PRD: 116 % (03/05/2010 16:35) Added new observation of  FEV1PRDPST: 2.77 L (03/05/2010 16:35) Added new observation of POST FEV1: 3.21 L/min (03/05/2010 16:35) Added new observation of POST FVC%EXP: 114 % (03/05/2010 16:35) Added new observation of FVCPRDPST: 3.67 L/min (03/05/2010 16:35) Added new observation of POST FVC: 4.20 L (03/05/2010 16:35) Added new observation of FEF % EXPEC: 87 % (03/05/2010 16:35) Added new observation of FEF25-75%PRE: 3.01 L/min (03/05/2010 16:35) Added new observation of FEF 25-75%: 2.62 L/min (03/05/2010 16:35) Added new observation of FEV1/FVC PRE: 74 % (03/05/2010 16:35) Added new observation of FEV1/FVC: 75 % (03/05/2010 16:35) Added new observation of FEV1 % EXP: 116 % (03/05/2010 16:35) Added new observation of FEV1 PREDICT: 2.77 L (03/05/2010 16:35) Added new observation of FEV1: 3.21 L (03/05/2010 16:35) Added new observation of FVC % EXPECT: 117 % (03/05/2010 16:35) Added new observation of FVC PREDICT: 3.67 L (03/05/2010 16:35) Added new observation of FVC: 4.30 L (03/05/2010 16:35) Added new observation of PFT HEIGHT: 68  (03/05/2010 16:35) Added new observation of PFT DATE: 03/02/2010  (03/05/2010 16:35)  Appended Document: PFT result noted  patient aware

## 2011-02-24 LAB — URINALYSIS, ROUTINE W REFLEX MICROSCOPIC
Bilirubin Urine: NEGATIVE
Glucose, UA: NEGATIVE mg/dL
Ketones, ur: NEGATIVE mg/dL
Protein, ur: NEGATIVE mg/dL
pH: 7.5 (ref 5.0–8.0)

## 2011-02-24 LAB — BASIC METABOLIC PANEL
CO2: 30 mEq/L (ref 19–32)
Chloride: 103 mEq/L (ref 96–112)
Creatinine, Ser: 0.68 mg/dL (ref 0.4–1.2)
GFR calc Af Amer: >60 mL/min (ref >60)
Potassium: 4.2 mEq/L (ref 3.5–5.1)
Sodium: 139 mEq/L (ref 135–145)

## 2011-02-24 LAB — DIFFERENTIAL
Lymphs Abs: 0.7 10*3/uL (ref 0.7–4.0)
Monocytes Relative: 5 % (ref 3–12)
Neutro Abs: 6.4 10*3/uL (ref 1.7–7.7)
Neutrophils Relative %: 84 % — ABNORMAL HIGH (ref 43–77)

## 2011-02-24 LAB — CBC
HCT: 41.2 % (ref 36.0–46.0)
Hemoglobin: 14.5 g/dL (ref 12.0–15.0)
MCH: 32.9 pg (ref 26.0–34.0)
MCV: 93.6 fL (ref 78.0–100.0)
Platelets: 198 10*3/uL (ref 150–400)
RBC: 4.41 MIL/uL (ref 3.87–5.11)
WBC: 7.6 10*3/uL (ref 4.0–10.5)

## 2011-02-24 LAB — URINE MICROSCOPIC-ADD ON

## 2011-12-15 ENCOUNTER — Emergency Department (HOSPITAL_COMMUNITY)
Admission: EM | Admit: 2011-12-15 | Discharge: 2011-12-15 | Disposition: A | Discharge status: Home / Self Care | Payer: 59 | Attending: Emergency Medicine | Admitting: Emergency Medicine

## 2011-12-15 ENCOUNTER — Encounter: Payer: Self-pay | Admitting: Emergency Medicine

## 2011-12-15 ENCOUNTER — Emergency Department (HOSPITAL_COMMUNITY): Payer: 59

## 2011-12-15 DIAGNOSIS — D3500 Benign neoplasm of unspecified adrenal gland: Secondary | ICD-10-CM | POA: Insufficient documentation

## 2011-12-15 DIAGNOSIS — N200 Calculus of kidney: Secondary | ICD-10-CM | POA: Insufficient documentation

## 2011-12-15 DIAGNOSIS — R109 Unspecified abdominal pain: Secondary | ICD-10-CM | POA: Insufficient documentation

## 2011-12-15 DIAGNOSIS — N390 Urinary tract infection, site not specified: Secondary | ICD-10-CM | POA: Insufficient documentation

## 2011-12-15 HISTORY — DX: Other specified disorders of nose and nasal sinuses: J34.89

## 2011-12-15 HISTORY — DX: Disorder of kidney and ureter, unspecified: N28.9

## 2011-12-15 LAB — URINALYSIS, ROUTINE W REFLEX MICROSCOPIC
Glucose, UA: NEGATIVE mg/dL
Hgb urine dipstick: NEGATIVE
Specific Gravity, Urine: 1.015 (ref 1.005–1.030)
pH: 6 (ref 5.0–8.0)

## 2011-12-15 LAB — URINE MICROSCOPIC-ADD ON

## 2011-12-15 LAB — POCT I-STAT, CHEM 8
Glucose, Bld: 90 mg/dL (ref 70–99)
HCT: 41 % (ref 36.0–46.0)
Hemoglobin: 13.9 g/dL (ref 12.0–15.0)
Potassium: 4.2 mEq/L (ref 3.5–5.1)
Sodium: 141 mEq/L (ref 135–145)

## 2011-12-15 MED ORDER — HYDROCODONE-ACETAMINOPHEN 5-500 MG PO TABS: 1.0000 | ORAL_TABLET | Freq: Four times a day (QID) | ORAL | Status: AC | PRN

## 2011-12-15 MED ORDER — CIPROFLOXACIN HCL 500 MG PO TABS: 500.0000 mg | ORAL_TABLET | Freq: Two times a day (BID) | ORAL | Status: AC

## 2011-12-15 MED ORDER — SODIUM CHLORIDE 0.45 % IV SOLN
INTRAVENOUS | Status: DC
  Administered 2011-12-15: 14:00:00 via INTRAVENOUS

## 2011-12-15 MED ORDER — KETOROLAC TROMETHAMINE 10 MG PO TABS
10.0000 mg | ORAL_TABLET | Freq: Four times a day (QID) | ORAL | Status: AC | PRN
Start: 2011-12-15 — End: 2011-12-20

## 2011-12-15 MED ORDER — HYDROCODONE-ACETAMINOPHEN 5-325 MG PO TABS: 1.0000 | ORAL_TABLET | ORAL | Status: AC | PRN

## 2011-12-15 MED ORDER — ONDANSETRON HCL 4 MG/2ML IJ SOLN
4.0000 mg | Freq: Once | INTRAMUSCULAR | Status: AC
  Administered 2011-12-15: 4 mg via INTRAVENOUS
  Filled 2011-12-15: qty 2

## 2011-12-15 MED ORDER — KETOROLAC TROMETHAMINE 10 MG PO TABS: 10.0000 mg | ORAL_TABLET | Freq: Four times a day (QID) | ORAL | Status: AC | PRN

## 2011-12-15 MED ORDER — KETOROLAC TROMETHAMINE 30 MG/ML IJ SOLN
30.0000 mg | Freq: Once | INTRAMUSCULAR | Status: AC
  Administered 2011-12-15: 30 mg via INTRAVENOUS
  Filled 2011-12-15: qty 1

## 2011-12-15 NOTE — ED Provider Notes (Signed)
History     CSN: [id number]  Arrival date & time 12/15/11  1221   First MD Initiated Contact with Patient 12/15/11 1249      Chief Complaint  Patient presents with  . Flank Pain   Patient presents with diffuse right flank pain, over past 3 days. She's also been having nausea, but denies fevers. She has had some "pressure" in the bladder area. She, states she's had multiple kidney stones in the past and this feels similar to a kidney stone. She was requesting that a CAT scan done. Denies any fever or dizziness. (Consider location/radiation/quality/duration/timing/severity/associated sxs/prior treatment) HPI  Past Medical History  Diagnosis Date  . Renal disorder   . Sinus pain     No past surgical history on file.  No family history on file.  History  Substance Use Topics  . Smoking status: Never Smoker   . Smokeless tobacco: Not on file  . Alcohol Use: Yes    OB History    Grav Para Term Preterm Abortions TAB SAB Ect Mult Living                  Review of Systems  All other systems reviewed and are negative.    Allergies  Review of patient's allergies indicates no known allergies.  Home Medications   Current Outpatient Rx  Name Route Sig Dispense Refill  . BIOTENE DRY MOUTH MT LIQD Mouth Rinse 15 mLs by Mouth Rinse route as needed.      Marland Kitchen CALCIUM & MAGNESIUM CARBONATES 311-232 MG PO TABS Oral Take 1 tablet by mouth daily.      Marland Kitchen CALCIUM CARBONATE-VITAMIN D 500-200 MG-UNIT PO TABS Oral Take 1 tablet by mouth daily.      . OMEGA-3 FATTY ACIDS 1000 MG PO CAPS Oral Take 2 g by mouth daily.      Marland Kitchen GLUCOSAMINE-CHONDROITIN 500-400 MG PO TABS Oral Take 1 tablet by mouth 3 (three) times daily.      Marland Kitchen PROGESTERONE MICRONIZED 100 MG PO CAPS Oral Take 100 mg by mouth daily.      Marland Kitchen VITAMIN E 100 UNITS PO CAPS Oral Take 100 Units by mouth daily.        BP 134/74  Pulse 50  Temp(Src) 98.7 F (37.1 C) (Oral)  Resp 18  SpO2 100%  Physical Exam  Nursing note and  vitals reviewed. Constitutional: She is oriented to person, place, and time. She appears well-developed and well-nourished.  HENT:  Head: Normocephalic and atraumatic.  Eyes: Conjunctivae and EOM are normal. Pupils are equal, round, and reactive to light.  Neck: Neck supple.  Cardiovascular: Normal rate and regular rhythm.  Exam reveals no gallop and no friction rub.   No murmur heard. Pulmonary/Chest: Breath sounds normal. She has no wheezes. She has no rales. She exhibits no tenderness.  Abdominal: Soft. Bowel sounds are normal. She exhibits no distension. There is no tenderness. There is no rebound and no guarding.  Genitourinary:       Right CVA tenderness  Musculoskeletal: Normal range of motion.  Neurological: She is alert and oriented to person, place, and time. No cranial nerve deficit. Coordination normal.  Skin: Skin is warm and dry. No rash noted.  Psychiatric: She has a normal mood and affect.    ED Course  Procedures (including critical care time)  Labs Reviewed - No data to display No results found.   No diagnosis found.    MDM  Pt is seen and examined;  Initial  history and physical completed.  Will follow.        2:00 PM  Results for orders placed during the hospital encounter of 12/15/11  URINALYSIS, ROUTINE W REFLEX MICROSCOPIC      Component Value Range   Color, Urine YELLOW  YELLOW    APPearance CLEAR  CLEAR    Specific Gravity, Urine 1.015  1.005 - 1.030    pH 6.0  5.0 - 8.0    Glucose, UA NEGATIVE  NEGATIVE (mg/dL)   Hgb urine dipstick NEGATIVE  NEGATIVE    Bilirubin Urine NEGATIVE  NEGATIVE    Ketones, ur NEGATIVE  NEGATIVE (mg/dL)   Protein, ur NEGATIVE  NEGATIVE (mg/dL)   Urobilinogen, UA 0.2  0.0 - 1.0 (mg/dL)   Nitrite NEGATIVE  NEGATIVE    Leukocytes, UA TRACE (*) NEGATIVE   URINE MICROSCOPIC-ADD ON      Component Value Range   Squamous Epithelial / LPF RARE  RARE    WBC, UA 3-6  <3 (WBC/hpf)   RBC / HPF 3-6  <3 (RBC/hpf)  POCT  I-STAT, CHEM 8      Component Value Range   Sodium 141  135 - 145 (mEq/L)   Potassium 4.2  3.5 - 5.1 (mEq/L)   Chloride 104  96 - 112 (mEq/L)   BUN 17  6 - 23 (mg/dL)   Creatinine, Ser 1.61  0.50 - 1.10 (mg/dL)   Glucose, Bld 90  70 - 99 (mg/dL)   Calcium, Ion 0.96  0.45 - 1.32 (mmol/L)   TCO2 26  0 - 100 (mmol/L)   Hemoglobin 13.9  12.0 - 15.0 (g/dL)   HCT 40.9  81.1 - 91.4 (%)   Ct Abdomen Pelvis Wo Contrast  12/15/2011  *RADIOLOGY REPORT*  Clinical Data: Right flank pain.  History of nephrolithiasis.  CT ABDOMEN AND PELVIS WITHOUT CONTRAST  Technique:  Multidetector CT imaging of the abdomen and pelvis was performed following the standard protocol without intravenous contrast.  Comparison: Abdomen radiograph dated 02/15/2008.  Findings: Large number of bilateral renal calculi.  The largest on the right is in the mid kidney and measures 10 mm in maximum diameter.  The largest on the left is in the mid to upper kidney, measuring 10 mm in maximum diameter.  No bladder or ureteral calculi are seen.  There is a 6 mm rounded calcification in the inferior right pelvis.  This appears separate from the ureter, compatible with a phlebolith.  Unremarkable noncontrasted appearance of the liver, spleen, pancreas, gallbladder and right adrenal gland.  1.5 x 1.2 cm low density left adrenal mass measuring -17 Hounsfield units on image number 15.  No gastrointestinal abnormalities or enlarged lymph nodes.  Normal appearing appendix in the right pelvis.  Anteflexed uterus.  No adnexal masses seen.  Lower thoracic spine degenerative changes.  IMPRESSION:  1.  Large number of bilateral, nonobstructing renal calculi. 2.  1.5 cm left adrenal adenoma.  Original Report Authenticated By: Darrol Angel, M.D.     2:18 PM Discussed, urine test, blood tests and CAT scan with patient. There was a large number of bilateral nonobstructing renal calculi. However, there was no ureteral calculi. Patient continues to complain of  bladder pressure, and felt that she also had an infection that was beginning. There was a trace leukocytes noted in her urine. Patient will be discharged home at her request on hydrocodone and ketorolac, and was requesting an antibiotic for a possible early UTI. She will try to see Dr. Brynda Greathouse. Early  this week for followup      Surina Storts A. Patrica Duel, MD 12/15/11 1419

## 2011-12-15 NOTE — ED Notes (Signed)
Patient given discharge instructions, information, prescriptions, and diet order. Patient states that they adequately understand discharge information given and to return to ED if symptoms return or worsen.     

## 2011-12-15 NOTE — ED Notes (Signed)
Dull right ache in flank area since Friday.  Patient is alert/oriented.  Appears comfortable at present time.  History of kidney stones since age 56.

## 2011-12-25 ENCOUNTER — Ambulatory Visit: Payer: Self-pay | Admitting: *Deleted

## 2011-12-27 ENCOUNTER — Ambulatory Visit (INDEPENDENT_AMBULATORY_CARE_PROVIDER_SITE_OTHER): Payer: 59 | Admitting: *Deleted

## 2011-12-27 ENCOUNTER — Encounter: Payer: Self-pay | Admitting: *Deleted

## 2011-12-27 DIAGNOSIS — I781 Nevus, non-neoplastic: Secondary | ICD-10-CM

## 2011-12-27 NOTE — Progress Notes (Signed)
X=.3% Sotradecol administered with a 27g butterfly.  Patient received a total of 6cc foam.  Treated all areas of concern. Two reticulars in back of legs and the rest were spiders. Extremely easy access. Anticipate great results. Will follow prn.  Photos: yes  Compression stockings applied: yes

## 2013-04-30 IMAGING — CT CT ABD-PELV W/O CM
2 of 4 series · 16 of 46 positions shown, 18 images · non-contrast
Comparison: Abdomen radiograph dated 02/15/2008.

CLINICAL DATA: Right flank pain.  History of nephrolithiasis.

CT ABDOMEN AND PELVIS WITHOUT CONTRAST
TECHNIQUE: Multidetector CT imaging of the abdomen and pelvis was
performed following the standard protocol without intravenous
contrast.

[Series 2: abd/pel w/o · axial · non-contrast · 0.66mm/px · z∈[-170,+210]mm · 13 of 84 slices shown, 15 images]
[im 4/84  soft-tissue]
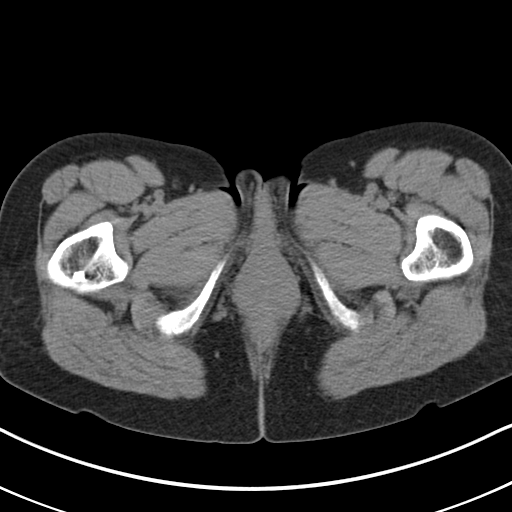
[im 4/84  bone]
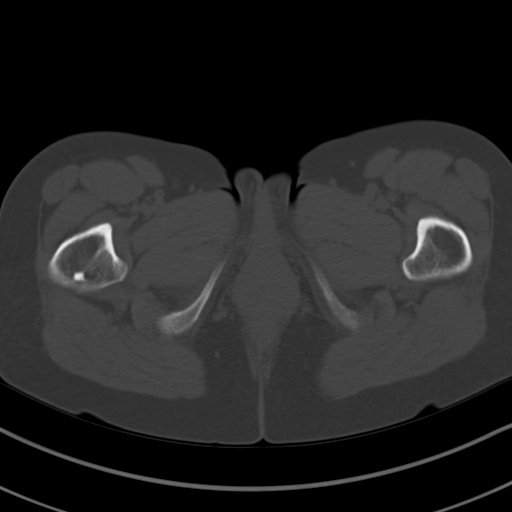
[im 10/84  soft-tissue]
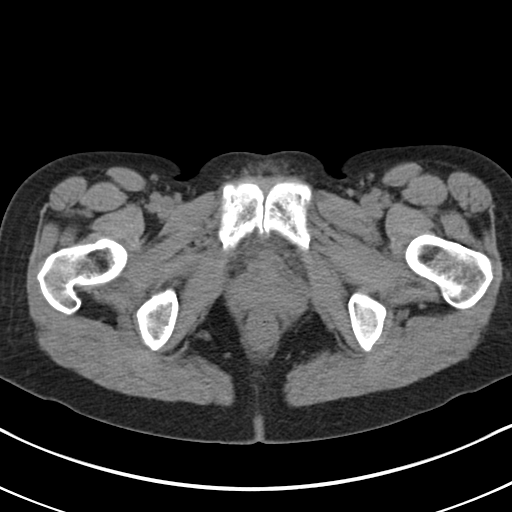
[im 16/84  soft-tissue]
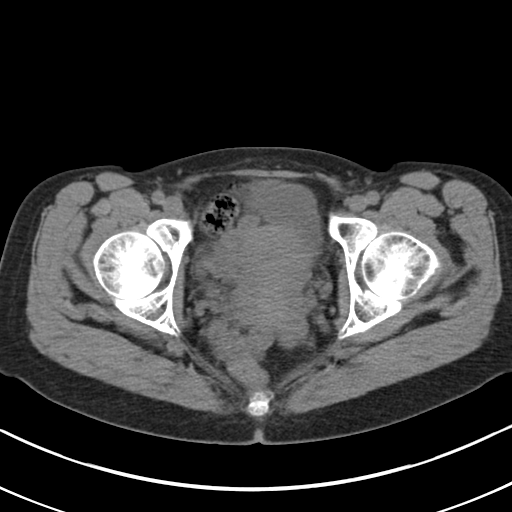
[im 23/84  soft-tissue]
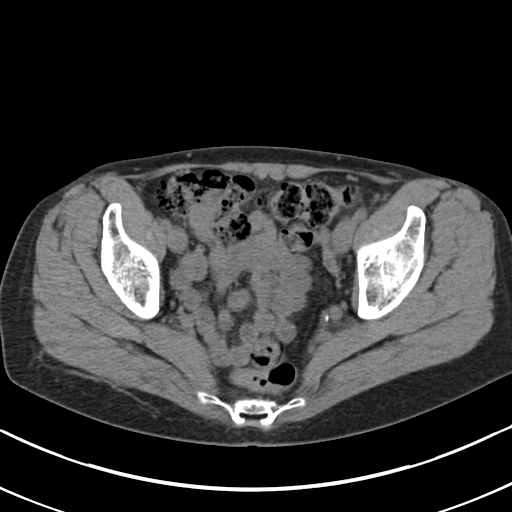
[im 29/84  soft-tissue]
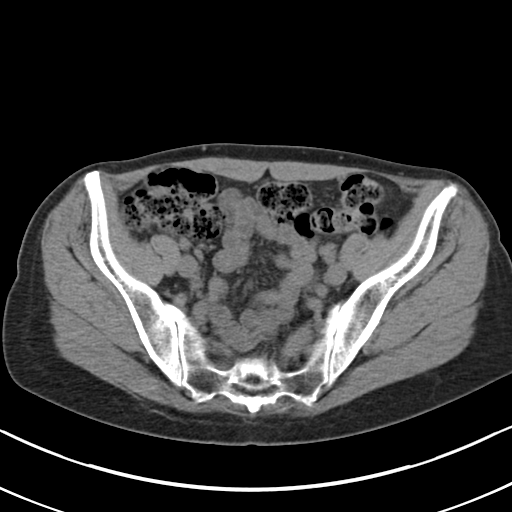
[im 36/84  soft-tissue]
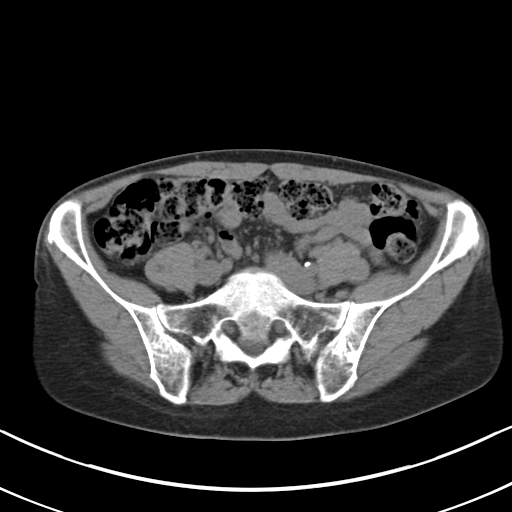
[im 42/84  soft-tissue]
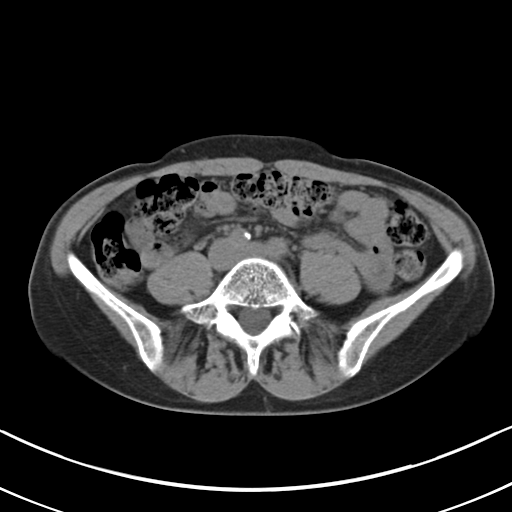
[im 48/84  soft-tissue]
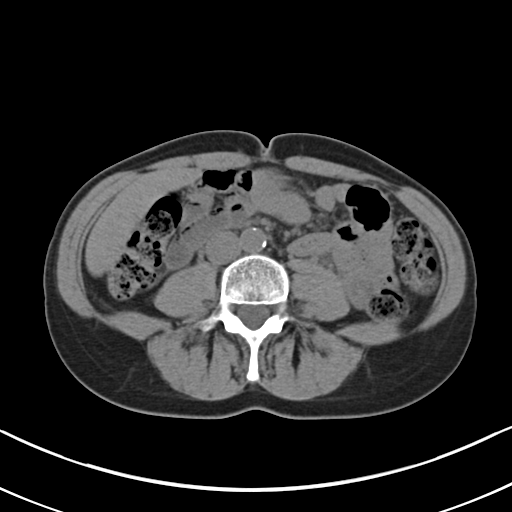
[im 55/84  soft-tissue]
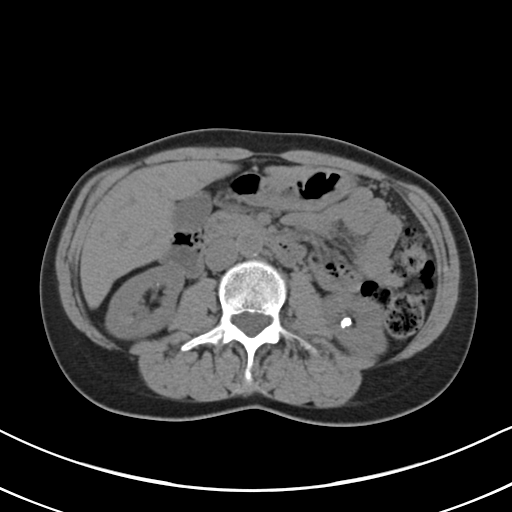
[im 55/84  bone]
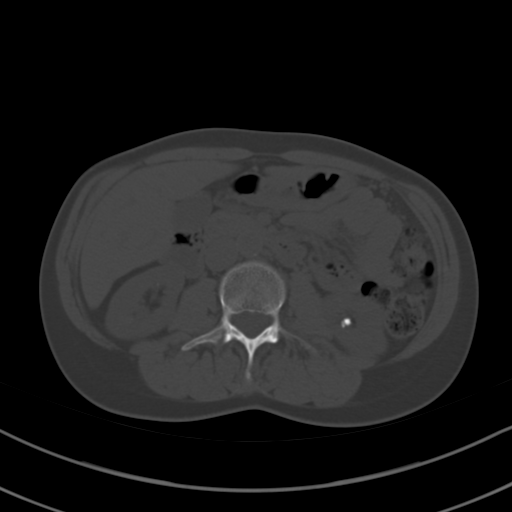
[im 61/84  soft-tissue]
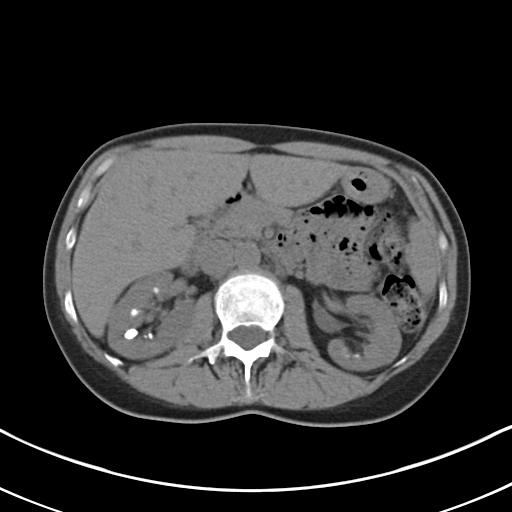
[im 68/84  soft-tissue]
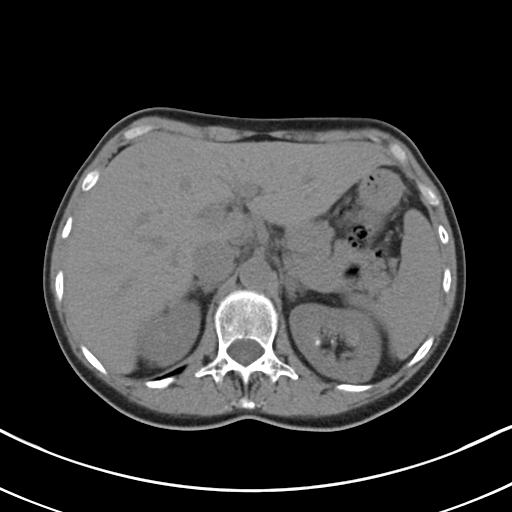
[im 74/84  soft-tissue]
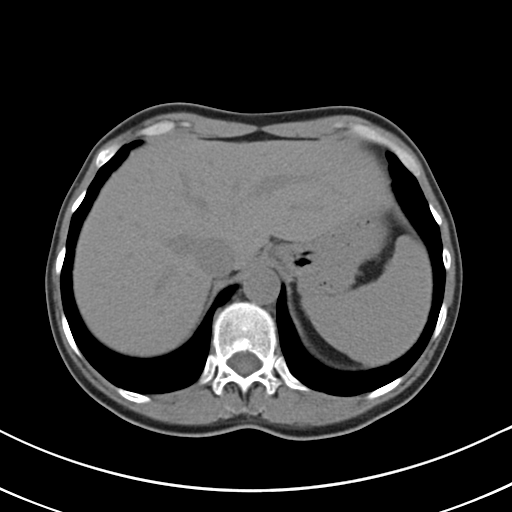
[im 80/84  soft-tissue]
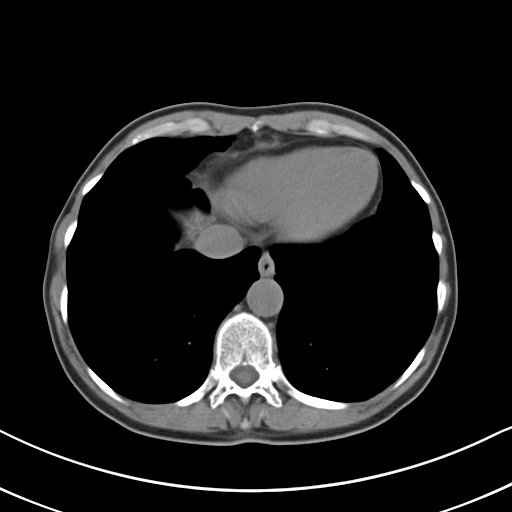

[Series 5: coronal · coronal · 0.64mm/px · 3 of 43 slices shown]
[im 15/43  soft-tissue]
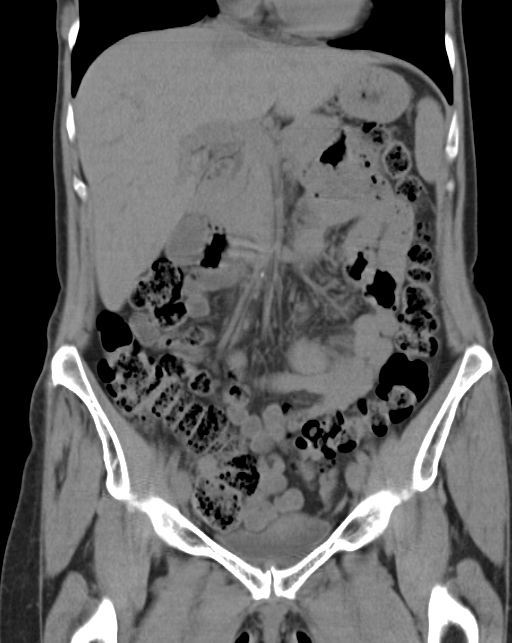
[im 19/43  soft-tissue]
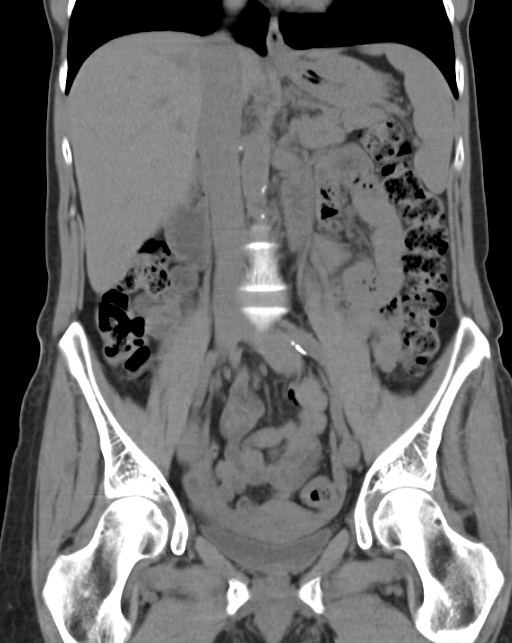
[im 24/43  soft-tissue]
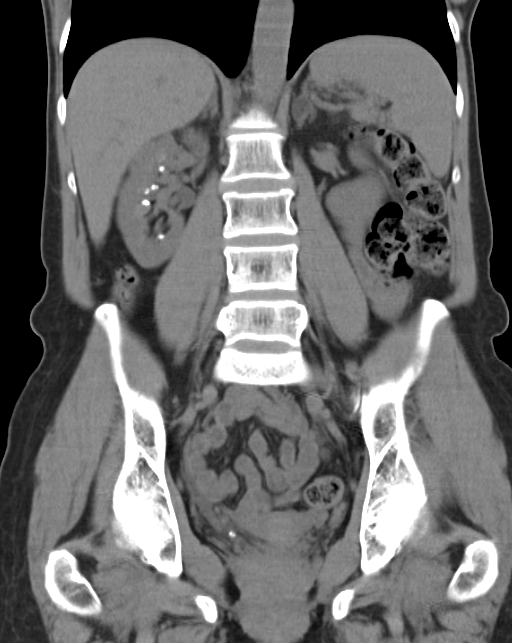

[16 of 46 positions shown; findings below may reference images not displayed]

FINDINGS: Large number of bilateral renal calculi.  The largest on
the right is in the mid kidney and measures 10 mm in maximum
diameter.  The largest on the left is in the mid to upper kidney,
measuring 10 mm in maximum diameter.  No bladder or ureteral
calculi are seen.  There is a 6 mm rounded calcification in the
inferior right pelvis.  This appears separate from the ureter,
compatible with a phlebolith.

Unremarkable noncontrasted appearance of the liver, spleen,
pancreas, gallbladder and right adrenal gland.  1.5 x 1.2 cm low
density left adrenal mass measuring -17 Hounsfield units on image
number 15.  No gastrointestinal abnormalities or enlarged lymph
nodes.  Normal appearing appendix in the right pelvis.  Anteflexed
uterus.  No adnexal masses seen.  Lower thoracic spine degenerative
changes.
IMPRESSION: 1.  Large number of bilateral, nonobstructing renal calculi.
2.  1.5 cm left adrenal adenoma.

## 2013-12-28 ENCOUNTER — Ambulatory Visit: Payer: BC Managed Care – PPO | Attending: Family Medicine

## 2013-12-28 DIAGNOSIS — IMO0001 Reserved for inherently not codable concepts without codable children: Secondary | ICD-10-CM | POA: Insufficient documentation

## 2013-12-28 DIAGNOSIS — M542 Cervicalgia: Secondary | ICD-10-CM | POA: Insufficient documentation

## 2013-12-28 DIAGNOSIS — R5381 Other malaise: Secondary | ICD-10-CM | POA: Insufficient documentation

## 2013-12-28 DIAGNOSIS — M25519 Pain in unspecified shoulder: Secondary | ICD-10-CM | POA: Insufficient documentation

## 2013-12-31 ENCOUNTER — Ambulatory Visit: Payer: BC Managed Care – PPO | Admitting: Physical Therapy

## 2014-01-04 ENCOUNTER — Ambulatory Visit: Payer: BC Managed Care – PPO

## 2014-01-06 ENCOUNTER — Encounter: Payer: BC Managed Care – PPO | Admitting: Physical Therapy

## 2014-01-07 ENCOUNTER — Ambulatory Visit: Payer: BC Managed Care – PPO | Admitting: Physical Therapy

## 2014-01-10 ENCOUNTER — Ambulatory Visit: Payer: BC Managed Care – PPO | Attending: Family Medicine | Admitting: Physical Therapy

## 2014-01-10 DIAGNOSIS — M25519 Pain in unspecified shoulder: Secondary | ICD-10-CM | POA: Insufficient documentation

## 2014-01-10 DIAGNOSIS — M542 Cervicalgia: Secondary | ICD-10-CM | POA: Insufficient documentation

## 2014-01-10 DIAGNOSIS — R5381 Other malaise: Secondary | ICD-10-CM | POA: Insufficient documentation

## 2014-01-10 DIAGNOSIS — IMO0001 Reserved for inherently not codable concepts without codable children: Secondary | ICD-10-CM | POA: Insufficient documentation

## 2014-01-11 ENCOUNTER — Ambulatory Visit: Payer: BC Managed Care – PPO

## 2014-01-12 ENCOUNTER — Ambulatory Visit: Payer: BC Managed Care – PPO | Admitting: Physical Therapy

## 2014-01-18 ENCOUNTER — Encounter: Payer: BC Managed Care – PPO | Admitting: Physical Therapy

## 2014-01-21 ENCOUNTER — Ambulatory Visit: Payer: BC Managed Care – PPO | Admitting: Physical Therapy

## 2014-01-25 ENCOUNTER — Ambulatory Visit: Payer: BC Managed Care – PPO

## 2014-01-28 ENCOUNTER — Ambulatory Visit: Payer: BC Managed Care – PPO | Admitting: Physical Therapy

## 2015-02-13 ENCOUNTER — Ambulatory Visit (INDEPENDENT_AMBULATORY_CARE_PROVIDER_SITE_OTHER): Payer: BLUE CROSS/BLUE SHIELD | Admitting: Podiatry

## 2015-02-13 ENCOUNTER — Encounter: Payer: Self-pay | Admitting: Podiatry

## 2015-02-13 VITALS — BP 133/76 | HR 61 | Resp 12

## 2015-02-13 DIAGNOSIS — B351 Tinea unguium: Secondary | ICD-10-CM | POA: Diagnosis not present

## 2015-02-13 NOTE — Progress Notes (Signed)
   Subjective:    Patient ID: Vanessa Diaz, female    DOB: 1956-06-16, 59 y.o.   MRN: [id number]  HPI  PT STATED B/L GREAT TOENAIL HAVE DISCOLORATION FOR 1 YEAR. THE TOENAILS ARE GETTING WORSE AND IS GETTING BETTER. TRIED RX. LAMISIL 2X AND TOPICAL ANTIFUNGAL BUT NO HELP.  Review of Systems  Skin: Positive for color change.  All other systems reviewed and are negative.      Objective:   Physical Exam        Assessment & Plan:

## 2015-02-14 NOTE — Progress Notes (Signed)
Subjective:     Patient ID: Vanessa Diaz, female   DOB: 1956-01-29, 59 y.o.   MRN: [id number]  HPI patient states that she has had coloration of her big toenails and fifth toenails of both feet for around a year. She has been on 2 separate 90 day courses of Lamisil which is helped some but she still has discoloration in the big toenails which is of concern to her and also a small amount on the fifth nails.   Review of Systems  All other systems reviewed and are negative.      Objective:   Physical Exam  Constitutional: She is oriented to person, place, and time.  Cardiovascular: Intact distal pulses.   Musculoskeletal: Normal range of motion.  Neurological: She is oriented to person, place, and time.  Skin: Skin is warm.  Nursing note and vitals reviewed.  neurovascular status intact with muscle strength adequate and range of motion subtalar midtarsal joint within normal limits. 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 She wants to pursue this course and she is scheduled for procedure

## 2015-03-01 ENCOUNTER — Ambulatory Visit: Payer: BLUE CROSS/BLUE SHIELD | Admitting: Podiatry

## 2015-03-13 ENCOUNTER — Ambulatory Visit: Payer: BLUE CROSS/BLUE SHIELD | Admitting: Podiatry

## 2015-03-27 ENCOUNTER — Ambulatory Visit: Payer: BLUE CROSS/BLUE SHIELD | Admitting: Podiatry

## 2015-04-10 ENCOUNTER — Ambulatory Visit: Payer: BLUE CROSS/BLUE SHIELD | Admitting: Podiatry

## 2015-08-08 ENCOUNTER — Other Ambulatory Visit: Payer: Self-pay | Admitting: Obstetrics and Gynecology

## 2015-08-08 DIAGNOSIS — R928 Other abnormal and inconclusive findings on diagnostic imaging of breast: Secondary | ICD-10-CM

## 2015-08-11 ENCOUNTER — Ambulatory Visit
Admission: RE | Admit: 2015-08-11 | Discharge: 2015-08-11 | Disposition: A | Discharge status: Home / Self Care | Payer: BLUE CROSS/BLUE SHIELD | Source: Ambulatory Visit | Attending: Obstetrics and Gynecology | Admitting: Obstetrics and Gynecology

## 2015-08-11 DIAGNOSIS — R928 Other abnormal and inconclusive findings on diagnostic imaging of breast: Secondary | ICD-10-CM

## 2016-03-28 DIAGNOSIS — N200 Calculus of kidney: Secondary | ICD-10-CM | POA: Diagnosis not present

## 2016-04-29 DIAGNOSIS — N200 Calculus of kidney: Secondary | ICD-10-CM | POA: Diagnosis not present

## 2016-05-22 DIAGNOSIS — M7542 Impingement syndrome of left shoulder: Secondary | ICD-10-CM | POA: Diagnosis not present

## 2016-05-22 DIAGNOSIS — M9901 Segmental and somatic dysfunction of cervical region: Secondary | ICD-10-CM | POA: Diagnosis not present

## 2016-05-22 DIAGNOSIS — M9902 Segmental and somatic dysfunction of thoracic region: Secondary | ICD-10-CM | POA: Diagnosis not present

## 2016-05-22 DIAGNOSIS — R51 Headache: Secondary | ICD-10-CM | POA: Diagnosis not present

## 2016-05-22 DIAGNOSIS — M9907 Segmental and somatic dysfunction of upper extremity: Secondary | ICD-10-CM | POA: Diagnosis not present

## 2016-06-07 DIAGNOSIS — M9902 Segmental and somatic dysfunction of thoracic region: Secondary | ICD-10-CM | POA: Diagnosis not present

## 2016-06-07 DIAGNOSIS — M791 Myalgia: Secondary | ICD-10-CM | POA: Diagnosis not present

## 2016-06-07 DIAGNOSIS — M7542 Impingement syndrome of left shoulder: Secondary | ICD-10-CM | POA: Diagnosis not present

## 2016-06-07 DIAGNOSIS — M9901 Segmental and somatic dysfunction of cervical region: Secondary | ICD-10-CM | POA: Diagnosis not present

## 2016-06-07 DIAGNOSIS — M9907 Segmental and somatic dysfunction of upper extremity: Secondary | ICD-10-CM | POA: Diagnosis not present

## 2016-06-12 DIAGNOSIS — R51 Headache: Secondary | ICD-10-CM | POA: Diagnosis not present

## 2016-06-12 DIAGNOSIS — M9902 Segmental and somatic dysfunction of thoracic region: Secondary | ICD-10-CM | POA: Diagnosis not present

## 2016-06-12 DIAGNOSIS — M9901 Segmental and somatic dysfunction of cervical region: Secondary | ICD-10-CM | POA: Diagnosis not present

## 2016-06-12 DIAGNOSIS — M9907 Segmental and somatic dysfunction of upper extremity: Secondary | ICD-10-CM | POA: Diagnosis not present

## 2016-06-12 DIAGNOSIS — M7542 Impingement syndrome of left shoulder: Secondary | ICD-10-CM | POA: Diagnosis not present

## 2016-06-12 DIAGNOSIS — M791 Myalgia: Secondary | ICD-10-CM | POA: Diagnosis not present

## 2016-06-14 DIAGNOSIS — R51 Headache: Secondary | ICD-10-CM | POA: Diagnosis not present

## 2016-06-14 DIAGNOSIS — M9907 Segmental and somatic dysfunction of upper extremity: Secondary | ICD-10-CM | POA: Diagnosis not present

## 2016-06-14 DIAGNOSIS — M9901 Segmental and somatic dysfunction of cervical region: Secondary | ICD-10-CM | POA: Diagnosis not present

## 2016-06-14 DIAGNOSIS — M791 Myalgia: Secondary | ICD-10-CM | POA: Diagnosis not present

## 2016-06-14 DIAGNOSIS — M9902 Segmental and somatic dysfunction of thoracic region: Secondary | ICD-10-CM | POA: Diagnosis not present

## 2016-06-14 DIAGNOSIS — M7542 Impingement syndrome of left shoulder: Secondary | ICD-10-CM | POA: Diagnosis not present

## 2016-06-18 DIAGNOSIS — M9901 Segmental and somatic dysfunction of cervical region: Secondary | ICD-10-CM | POA: Diagnosis not present

## 2016-06-18 DIAGNOSIS — M791 Myalgia: Secondary | ICD-10-CM | POA: Diagnosis not present

## 2016-06-18 DIAGNOSIS — M7542 Impingement syndrome of left shoulder: Secondary | ICD-10-CM | POA: Diagnosis not present

## 2016-06-18 DIAGNOSIS — R51 Headache: Secondary | ICD-10-CM | POA: Diagnosis not present

## 2016-06-18 DIAGNOSIS — M9902 Segmental and somatic dysfunction of thoracic region: Secondary | ICD-10-CM | POA: Diagnosis not present

## 2016-06-18 DIAGNOSIS — M9907 Segmental and somatic dysfunction of upper extremity: Secondary | ICD-10-CM | POA: Diagnosis not present

## 2016-06-19 DIAGNOSIS — H01005 Unspecified blepharitis left lower eyelid: Secondary | ICD-10-CM | POA: Diagnosis not present

## 2016-06-19 DIAGNOSIS — H01002 Unspecified blepharitis right lower eyelid: Secondary | ICD-10-CM | POA: Diagnosis not present

## 2016-06-26 DIAGNOSIS — M9902 Segmental and somatic dysfunction of thoracic region: Secondary | ICD-10-CM | POA: Diagnosis not present

## 2016-06-26 DIAGNOSIS — M9901 Segmental and somatic dysfunction of cervical region: Secondary | ICD-10-CM | POA: Diagnosis not present

## 2016-06-26 DIAGNOSIS — M7542 Impingement syndrome of left shoulder: Secondary | ICD-10-CM | POA: Diagnosis not present

## 2016-06-26 DIAGNOSIS — M791 Myalgia: Secondary | ICD-10-CM | POA: Diagnosis not present

## 2016-06-26 DIAGNOSIS — R51 Headache: Secondary | ICD-10-CM | POA: Diagnosis not present

## 2016-06-28 DIAGNOSIS — M9902 Segmental and somatic dysfunction of thoracic region: Secondary | ICD-10-CM | POA: Diagnosis not present

## 2016-06-28 DIAGNOSIS — M7542 Impingement syndrome of left shoulder: Secondary | ICD-10-CM | POA: Diagnosis not present

## 2016-06-28 DIAGNOSIS — R51 Headache: Secondary | ICD-10-CM | POA: Diagnosis not present

## 2016-06-28 DIAGNOSIS — M9901 Segmental and somatic dysfunction of cervical region: Secondary | ICD-10-CM | POA: Diagnosis not present

## 2016-06-28 DIAGNOSIS — M9907 Segmental and somatic dysfunction of upper extremity: Secondary | ICD-10-CM | POA: Diagnosis not present

## 2016-07-08 DIAGNOSIS — B078 Other viral warts: Secondary | ICD-10-CM | POA: Diagnosis not present

## 2016-07-08 DIAGNOSIS — R21 Rash and other nonspecific skin eruption: Secondary | ICD-10-CM | POA: Diagnosis not present

## 2016-07-08 DIAGNOSIS — D2372 Other benign neoplasm of skin of left lower limb, including hip: Secondary | ICD-10-CM | POA: Diagnosis not present

## 2016-07-08 DIAGNOSIS — L821 Other seborrheic keratosis: Secondary | ICD-10-CM | POA: Diagnosis not present

## 2016-07-10 DIAGNOSIS — M791 Myalgia: Secondary | ICD-10-CM | POA: Diagnosis not present

## 2016-07-10 DIAGNOSIS — M7542 Impingement syndrome of left shoulder: Secondary | ICD-10-CM | POA: Diagnosis not present

## 2016-07-10 DIAGNOSIS — M9907 Segmental and somatic dysfunction of upper extremity: Secondary | ICD-10-CM | POA: Diagnosis not present

## 2016-07-10 DIAGNOSIS — M9902 Segmental and somatic dysfunction of thoracic region: Secondary | ICD-10-CM | POA: Diagnosis not present

## 2016-07-10 DIAGNOSIS — R51 Headache: Secondary | ICD-10-CM | POA: Diagnosis not present

## 2016-07-10 DIAGNOSIS — M9901 Segmental and somatic dysfunction of cervical region: Secondary | ICD-10-CM | POA: Diagnosis not present

## 2016-07-16 DIAGNOSIS — M7542 Impingement syndrome of left shoulder: Secondary | ICD-10-CM | POA: Diagnosis not present

## 2016-07-16 DIAGNOSIS — M9901 Segmental and somatic dysfunction of cervical region: Secondary | ICD-10-CM | POA: Diagnosis not present

## 2016-07-16 DIAGNOSIS — R51 Headache: Secondary | ICD-10-CM | POA: Diagnosis not present

## 2016-07-16 DIAGNOSIS — M9907 Segmental and somatic dysfunction of upper extremity: Secondary | ICD-10-CM | POA: Diagnosis not present

## 2016-07-16 DIAGNOSIS — M9902 Segmental and somatic dysfunction of thoracic region: Secondary | ICD-10-CM | POA: Diagnosis not present

## 2016-08-16 DIAGNOSIS — Z0000 Encounter for general adult medical examination without abnormal findings: Secondary | ICD-10-CM | POA: Diagnosis not present

## 2016-08-21 DIAGNOSIS — Z1389 Encounter for screening for other disorder: Secondary | ICD-10-CM | POA: Diagnosis not present

## 2016-08-21 DIAGNOSIS — H00019 Hordeolum externum unspecified eye, unspecified eyelid: Secondary | ICD-10-CM | POA: Diagnosis not present

## 2016-08-21 DIAGNOSIS — Z23 Encounter for immunization: Secondary | ICD-10-CM | POA: Diagnosis not present

## 2016-08-21 DIAGNOSIS — Z0000 Encounter for general adult medical examination without abnormal findings: Secondary | ICD-10-CM | POA: Diagnosis not present

## 2016-08-21 DIAGNOSIS — E784 Other hyperlipidemia: Secondary | ICD-10-CM | POA: Diagnosis not present

## 2016-09-03 DIAGNOSIS — Z1212 Encounter for screening for malignant neoplasm of rectum: Secondary | ICD-10-CM | POA: Diagnosis not present

## 2016-10-24 DIAGNOSIS — Z1231 Encounter for screening mammogram for malignant neoplasm of breast: Secondary | ICD-10-CM | POA: Diagnosis not present

## 2016-10-24 DIAGNOSIS — Z6820 Body mass index (BMI) 20.0-20.9, adult: Secondary | ICD-10-CM | POA: Diagnosis not present

## 2016-10-24 DIAGNOSIS — Z01419 Encounter for gynecological examination (general) (routine) without abnormal findings: Secondary | ICD-10-CM | POA: Diagnosis not present

## 2016-11-04 DIAGNOSIS — M7542 Impingement syndrome of left shoulder: Secondary | ICD-10-CM | POA: Diagnosis not present

## 2016-11-07 DIAGNOSIS — M1712 Unilateral primary osteoarthritis, left knee: Secondary | ICD-10-CM | POA: Diagnosis not present

## 2016-11-14 DIAGNOSIS — M1712 Unilateral primary osteoarthritis, left knee: Secondary | ICD-10-CM | POA: Diagnosis not present

## 2016-11-21 DIAGNOSIS — M1712 Unilateral primary osteoarthritis, left knee: Secondary | ICD-10-CM | POA: Diagnosis not present

## 2016-12-18 DIAGNOSIS — D225 Melanocytic nevi of trunk: Secondary | ICD-10-CM | POA: Diagnosis not present

## 2016-12-18 DIAGNOSIS — D1801 Hemangioma of skin and subcutaneous tissue: Secondary | ICD-10-CM | POA: Diagnosis not present

## 2017-02-24 DIAGNOSIS — F321 Major depressive disorder, single episode, moderate: Secondary | ICD-10-CM | POA: Diagnosis not present

## 2017-03-03 DIAGNOSIS — F321 Major depressive disorder, single episode, moderate: Secondary | ICD-10-CM | POA: Diagnosis not present

## 2017-03-13 DIAGNOSIS — N952 Postmenopausal atrophic vaginitis: Secondary | ICD-10-CM | POA: Diagnosis not present

## 2017-03-13 DIAGNOSIS — R6882 Decreased libido: Secondary | ICD-10-CM | POA: Diagnosis not present

## 2017-03-13 DIAGNOSIS — F528 Other sexual dysfunction not due to a substance or known physiological condition: Secondary | ICD-10-CM | POA: Diagnosis not present

## 2017-03-14 DIAGNOSIS — M1712 Unilateral primary osteoarthritis, left knee: Secondary | ICD-10-CM | POA: Diagnosis not present

## 2017-03-19 DIAGNOSIS — R6882 Decreased libido: Secondary | ICD-10-CM | POA: Diagnosis not present

## 2017-03-24 DIAGNOSIS — F321 Major depressive disorder, single episode, moderate: Secondary | ICD-10-CM | POA: Diagnosis not present

## 2017-03-24 DIAGNOSIS — N200 Calculus of kidney: Secondary | ICD-10-CM | POA: Diagnosis not present

## 2017-03-31 DIAGNOSIS — F321 Major depressive disorder, single episode, moderate: Secondary | ICD-10-CM | POA: Diagnosis not present

## 2017-04-18 DIAGNOSIS — R6882 Decreased libido: Secondary | ICD-10-CM | POA: Diagnosis not present

## 2017-04-21 DIAGNOSIS — F321 Major depressive disorder, single episode, moderate: Secondary | ICD-10-CM | POA: Diagnosis not present

## 2017-04-30 DIAGNOSIS — R8299 Other abnormal findings in urine: Secondary | ICD-10-CM | POA: Diagnosis not present

## 2017-04-30 DIAGNOSIS — N200 Calculus of kidney: Secondary | ICD-10-CM | POA: Diagnosis not present

## 2017-05-07 DIAGNOSIS — F520 Hypoactive sexual desire disorder: Secondary | ICD-10-CM | POA: Diagnosis not present

## 2017-05-19 DIAGNOSIS — R6882 Decreased libido: Secondary | ICD-10-CM | POA: Diagnosis not present

## 2017-05-19 DIAGNOSIS — F321 Major depressive disorder, single episode, moderate: Secondary | ICD-10-CM | POA: Diagnosis not present

## 2017-06-02 DIAGNOSIS — H00022 Hordeolum internum right lower eyelid: Secondary | ICD-10-CM | POA: Diagnosis not present

## 2017-06-12 DIAGNOSIS — M1712 Unilateral primary osteoarthritis, left knee: Secondary | ICD-10-CM | POA: Diagnosis not present

## 2017-06-18 DIAGNOSIS — R6882 Decreased libido: Secondary | ICD-10-CM | POA: Diagnosis not present

## 2017-06-18 DIAGNOSIS — M1712 Unilateral primary osteoarthritis, left knee: Secondary | ICD-10-CM | POA: Diagnosis not present

## 2017-06-26 DIAGNOSIS — M1712 Unilateral primary osteoarthritis, left knee: Secondary | ICD-10-CM | POA: Diagnosis not present

## 2017-08-14 DIAGNOSIS — R6882 Decreased libido: Secondary | ICD-10-CM | POA: Diagnosis not present

## 2017-09-08 DIAGNOSIS — R6882 Decreased libido: Secondary | ICD-10-CM | POA: Diagnosis not present

## 2017-09-10 DIAGNOSIS — Z0000 Encounter for general adult medical examination without abnormal findings: Secondary | ICD-10-CM | POA: Diagnosis not present

## 2017-09-10 DIAGNOSIS — E7849 Other hyperlipidemia: Secondary | ICD-10-CM | POA: Diagnosis not present

## 2017-09-15 DIAGNOSIS — H1045 Other chronic allergic conjunctivitis: Secondary | ICD-10-CM | POA: Diagnosis not present

## 2017-09-17 DIAGNOSIS — Z0000 Encounter for general adult medical examination without abnormal findings: Secondary | ICD-10-CM | POA: Diagnosis not present

## 2017-09-17 DIAGNOSIS — E7849 Other hyperlipidemia: Secondary | ICD-10-CM | POA: Diagnosis not present

## 2017-09-17 DIAGNOSIS — R58 Hemorrhage, not elsewhere classified: Secondary | ICD-10-CM | POA: Diagnosis not present

## 2017-09-17 DIAGNOSIS — Z6823 Body mass index (BMI) 23.0-23.9, adult: Secondary | ICD-10-CM | POA: Diagnosis not present

## 2017-09-17 DIAGNOSIS — Z1389 Encounter for screening for other disorder: Secondary | ICD-10-CM | POA: Diagnosis not present

## 2017-09-17 DIAGNOSIS — M810 Age-related osteoporosis without current pathological fracture: Secondary | ICD-10-CM | POA: Diagnosis not present

## 2017-09-17 DIAGNOSIS — L658 Other specified nonscarring hair loss: Secondary | ICD-10-CM | POA: Diagnosis not present

## 2017-09-18 DIAGNOSIS — J3089 Other allergic rhinitis: Secondary | ICD-10-CM | POA: Diagnosis not present

## 2017-09-19 DIAGNOSIS — Z1212 Encounter for screening for malignant neoplasm of rectum: Secondary | ICD-10-CM | POA: Diagnosis not present

## 2017-09-22 DIAGNOSIS — Z23 Encounter for immunization: Secondary | ICD-10-CM | POA: Diagnosis not present

## 2017-09-26 DIAGNOSIS — D692 Other nonthrombocytopenic purpura: Secondary | ICD-10-CM | POA: Diagnosis not present

## 2017-09-26 DIAGNOSIS — Z79899 Other long term (current) drug therapy: Secondary | ICD-10-CM | POA: Diagnosis not present

## 2017-09-26 DIAGNOSIS — L650 Telogen effluvium: Secondary | ICD-10-CM | POA: Diagnosis not present

## 2017-10-06 DIAGNOSIS — H00025 Hordeolum internum left lower eyelid: Secondary | ICD-10-CM | POA: Diagnosis not present

## 2017-11-05 DIAGNOSIS — Z13820 Encounter for screening for osteoporosis: Secondary | ICD-10-CM | POA: Diagnosis not present

## 2017-11-05 DIAGNOSIS — Z01419 Encounter for gynecological examination (general) (routine) without abnormal findings: Secondary | ICD-10-CM | POA: Diagnosis not present

## 2017-11-05 DIAGNOSIS — Z6822 Body mass index (BMI) 22.0-22.9, adult: Secondary | ICD-10-CM | POA: Diagnosis not present

## 2017-11-05 DIAGNOSIS — Z1231 Encounter for screening mammogram for malignant neoplasm of breast: Secondary | ICD-10-CM | POA: Diagnosis not present

## 2017-12-22 DIAGNOSIS — R05 Cough: Secondary | ICD-10-CM | POA: Diagnosis not present

## 2017-12-22 DIAGNOSIS — J329 Chronic sinusitis, unspecified: Secondary | ICD-10-CM | POA: Diagnosis not present

## 2017-12-22 DIAGNOSIS — J0100 Acute maxillary sinusitis, unspecified: Secondary | ICD-10-CM | POA: Diagnosis not present

## 2017-12-25 DIAGNOSIS — J0100 Acute maxillary sinusitis, unspecified: Secondary | ICD-10-CM | POA: Diagnosis not present

## 2017-12-25 DIAGNOSIS — J329 Chronic sinusitis, unspecified: Secondary | ICD-10-CM | POA: Diagnosis not present

## 2017-12-25 DIAGNOSIS — R05 Cough: Secondary | ICD-10-CM | POA: Diagnosis not present

## 2018-01-01 DIAGNOSIS — J0191 Acute recurrent sinusitis, unspecified: Secondary | ICD-10-CM | POA: Diagnosis not present

## 2018-01-15 DIAGNOSIS — M1712 Unilateral primary osteoarthritis, left knee: Secondary | ICD-10-CM | POA: Diagnosis not present

## 2018-01-19 DIAGNOSIS — J40 Bronchitis, not specified as acute or chronic: Secondary | ICD-10-CM | POA: Diagnosis not present

## 2018-01-19 DIAGNOSIS — R05 Cough: Secondary | ICD-10-CM | POA: Diagnosis not present

## 2018-01-19 DIAGNOSIS — Z6822 Body mass index (BMI) 22.0-22.9, adult: Secondary | ICD-10-CM | POA: Diagnosis not present

## 2018-01-22 DIAGNOSIS — M1712 Unilateral primary osteoarthritis, left knee: Secondary | ICD-10-CM | POA: Diagnosis not present

## 2018-01-25 DIAGNOSIS — J40 Bronchitis, not specified as acute or chronic: Secondary | ICD-10-CM | POA: Diagnosis not present

## 2018-01-25 DIAGNOSIS — Z6822 Body mass index (BMI) 22.0-22.9, adult: Secondary | ICD-10-CM | POA: Diagnosis not present

## 2018-01-25 DIAGNOSIS — R05 Cough: Secondary | ICD-10-CM | POA: Diagnosis not present

## 2018-01-28 DIAGNOSIS — M1712 Unilateral primary osteoarthritis, left knee: Secondary | ICD-10-CM | POA: Diagnosis not present

## 2018-02-09 DIAGNOSIS — R05 Cough: Secondary | ICD-10-CM | POA: Diagnosis not present

## 2018-02-09 DIAGNOSIS — J301 Allergic rhinitis due to pollen: Secondary | ICD-10-CM | POA: Diagnosis not present

## 2018-02-09 DIAGNOSIS — J3089 Other allergic rhinitis: Secondary | ICD-10-CM | POA: Diagnosis not present

## 2018-02-09 DIAGNOSIS — J3081 Allergic rhinitis due to animal (cat) (dog) hair and dander: Secondary | ICD-10-CM | POA: Diagnosis not present

## 2018-02-11 DIAGNOSIS — L570 Actinic keratosis: Secondary | ICD-10-CM | POA: Diagnosis not present

## 2018-02-11 DIAGNOSIS — L821 Other seborrheic keratosis: Secondary | ICD-10-CM | POA: Diagnosis not present

## 2018-02-11 DIAGNOSIS — D2272 Melanocytic nevi of left lower limb, including hip: Secondary | ICD-10-CM | POA: Diagnosis not present

## 2018-02-11 DIAGNOSIS — D225 Melanocytic nevi of trunk: Secondary | ICD-10-CM | POA: Diagnosis not present

## 2018-02-11 DIAGNOSIS — D2262 Melanocytic nevi of left upper limb, including shoulder: Secondary | ICD-10-CM | POA: Diagnosis not present

## 2018-02-17 DIAGNOSIS — J301 Allergic rhinitis due to pollen: Secondary | ICD-10-CM | POA: Diagnosis not present

## 2018-02-18 DIAGNOSIS — J3089 Other allergic rhinitis: Secondary | ICD-10-CM | POA: Diagnosis not present

## 2018-02-25 DIAGNOSIS — J301 Allergic rhinitis due to pollen: Secondary | ICD-10-CM | POA: Diagnosis not present

## 2018-02-25 DIAGNOSIS — J3089 Other allergic rhinitis: Secondary | ICD-10-CM | POA: Diagnosis not present

## 2018-02-27 DIAGNOSIS — J301 Allergic rhinitis due to pollen: Secondary | ICD-10-CM | POA: Diagnosis not present

## 2018-02-27 DIAGNOSIS — J3089 Other allergic rhinitis: Secondary | ICD-10-CM | POA: Diagnosis not present

## 2018-03-02 DIAGNOSIS — J301 Allergic rhinitis due to pollen: Secondary | ICD-10-CM | POA: Diagnosis not present

## 2018-03-02 DIAGNOSIS — J3089 Other allergic rhinitis: Secondary | ICD-10-CM | POA: Diagnosis not present

## 2018-03-04 DIAGNOSIS — M1712 Unilateral primary osteoarthritis, left knee: Secondary | ICD-10-CM | POA: Diagnosis not present

## 2018-03-09 DIAGNOSIS — J301 Allergic rhinitis due to pollen: Secondary | ICD-10-CM | POA: Diagnosis not present

## 2018-03-09 DIAGNOSIS — J3089 Other allergic rhinitis: Secondary | ICD-10-CM | POA: Diagnosis not present

## 2018-03-11 DIAGNOSIS — J301 Allergic rhinitis due to pollen: Secondary | ICD-10-CM | POA: Diagnosis not present

## 2018-03-11 DIAGNOSIS — J3089 Other allergic rhinitis: Secondary | ICD-10-CM | POA: Diagnosis not present

## 2018-03-16 DIAGNOSIS — J3089 Other allergic rhinitis: Secondary | ICD-10-CM | POA: Diagnosis not present

## 2018-03-16 DIAGNOSIS — J301 Allergic rhinitis due to pollen: Secondary | ICD-10-CM | POA: Diagnosis not present

## 2018-03-17 DIAGNOSIS — N200 Calculus of kidney: Secondary | ICD-10-CM | POA: Diagnosis not present

## 2018-03-18 DIAGNOSIS — J3089 Other allergic rhinitis: Secondary | ICD-10-CM | POA: Diagnosis not present

## 2018-03-18 DIAGNOSIS — J301 Allergic rhinitis due to pollen: Secondary | ICD-10-CM | POA: Diagnosis not present

## 2018-03-30 DIAGNOSIS — J301 Allergic rhinitis due to pollen: Secondary | ICD-10-CM | POA: Diagnosis not present

## 2018-03-30 DIAGNOSIS — J3089 Other allergic rhinitis: Secondary | ICD-10-CM | POA: Diagnosis not present

## 2018-04-02 DIAGNOSIS — J301 Allergic rhinitis due to pollen: Secondary | ICD-10-CM | POA: Diagnosis not present

## 2018-04-02 DIAGNOSIS — J3089 Other allergic rhinitis: Secondary | ICD-10-CM | POA: Diagnosis not present

## 2018-04-08 DIAGNOSIS — J3089 Other allergic rhinitis: Secondary | ICD-10-CM | POA: Diagnosis not present

## 2018-04-08 DIAGNOSIS — J301 Allergic rhinitis due to pollen: Secondary | ICD-10-CM | POA: Diagnosis not present

## 2018-04-15 DIAGNOSIS — J3089 Other allergic rhinitis: Secondary | ICD-10-CM | POA: Diagnosis not present

## 2018-04-15 DIAGNOSIS — J301 Allergic rhinitis due to pollen: Secondary | ICD-10-CM | POA: Diagnosis not present

## 2018-04-17 DIAGNOSIS — J3089 Other allergic rhinitis: Secondary | ICD-10-CM | POA: Diagnosis not present

## 2018-04-17 DIAGNOSIS — J301 Allergic rhinitis due to pollen: Secondary | ICD-10-CM | POA: Diagnosis not present

## 2018-04-20 DIAGNOSIS — J301 Allergic rhinitis due to pollen: Secondary | ICD-10-CM | POA: Diagnosis not present

## 2018-04-20 DIAGNOSIS — J3089 Other allergic rhinitis: Secondary | ICD-10-CM | POA: Diagnosis not present

## 2018-04-23 DIAGNOSIS — J301 Allergic rhinitis due to pollen: Secondary | ICD-10-CM | POA: Diagnosis not present

## 2018-04-23 DIAGNOSIS — J3089 Other allergic rhinitis: Secondary | ICD-10-CM | POA: Diagnosis not present

## 2018-04-27 DIAGNOSIS — L570 Actinic keratosis: Secondary | ICD-10-CM | POA: Diagnosis not present

## 2018-04-29 DIAGNOSIS — J301 Allergic rhinitis due to pollen: Secondary | ICD-10-CM | POA: Diagnosis not present

## 2018-04-29 DIAGNOSIS — J3089 Other allergic rhinitis: Secondary | ICD-10-CM | POA: Diagnosis not present

## 2018-05-01 DIAGNOSIS — J3089 Other allergic rhinitis: Secondary | ICD-10-CM | POA: Diagnosis not present

## 2018-05-01 DIAGNOSIS — J301 Allergic rhinitis due to pollen: Secondary | ICD-10-CM | POA: Diagnosis not present

## 2018-05-08 DIAGNOSIS — J3089 Other allergic rhinitis: Secondary | ICD-10-CM | POA: Diagnosis not present

## 2018-05-08 DIAGNOSIS — J301 Allergic rhinitis due to pollen: Secondary | ICD-10-CM | POA: Diagnosis not present

## 2018-05-11 DIAGNOSIS — J3089 Other allergic rhinitis: Secondary | ICD-10-CM | POA: Diagnosis not present

## 2018-05-11 DIAGNOSIS — J301 Allergic rhinitis due to pollen: Secondary | ICD-10-CM | POA: Diagnosis not present

## 2018-05-13 DIAGNOSIS — J3089 Other allergic rhinitis: Secondary | ICD-10-CM | POA: Diagnosis not present

## 2018-05-13 DIAGNOSIS — J301 Allergic rhinitis due to pollen: Secondary | ICD-10-CM | POA: Diagnosis not present

## 2018-05-18 DIAGNOSIS — J3089 Other allergic rhinitis: Secondary | ICD-10-CM | POA: Diagnosis not present

## 2018-05-18 DIAGNOSIS — J301 Allergic rhinitis due to pollen: Secondary | ICD-10-CM | POA: Diagnosis not present

## 2018-05-21 DIAGNOSIS — J301 Allergic rhinitis due to pollen: Secondary | ICD-10-CM | POA: Diagnosis not present

## 2018-05-21 DIAGNOSIS — J3089 Other allergic rhinitis: Secondary | ICD-10-CM | POA: Diagnosis not present

## 2018-05-22 DIAGNOSIS — M1712 Unilateral primary osteoarthritis, left knee: Secondary | ICD-10-CM | POA: Diagnosis not present

## 2018-05-25 DIAGNOSIS — J3089 Other allergic rhinitis: Secondary | ICD-10-CM | POA: Diagnosis not present

## 2018-05-25 DIAGNOSIS — J301 Allergic rhinitis due to pollen: Secondary | ICD-10-CM | POA: Diagnosis not present

## 2018-06-01 DIAGNOSIS — J3089 Other allergic rhinitis: Secondary | ICD-10-CM | POA: Diagnosis not present

## 2018-06-01 DIAGNOSIS — J301 Allergic rhinitis due to pollen: Secondary | ICD-10-CM | POA: Diagnosis not present

## 2018-06-05 DIAGNOSIS — J3089 Other allergic rhinitis: Secondary | ICD-10-CM | POA: Diagnosis not present

## 2018-06-08 DIAGNOSIS — J301 Allergic rhinitis due to pollen: Secondary | ICD-10-CM | POA: Diagnosis not present

## 2018-06-08 DIAGNOSIS — J3089 Other allergic rhinitis: Secondary | ICD-10-CM | POA: Diagnosis not present

## 2018-06-10 DIAGNOSIS — J301 Allergic rhinitis due to pollen: Secondary | ICD-10-CM | POA: Diagnosis not present

## 2018-06-10 DIAGNOSIS — J3089 Other allergic rhinitis: Secondary | ICD-10-CM | POA: Diagnosis not present

## 2018-06-15 DIAGNOSIS — J3089 Other allergic rhinitis: Secondary | ICD-10-CM | POA: Diagnosis not present

## 2018-06-15 DIAGNOSIS — J301 Allergic rhinitis due to pollen: Secondary | ICD-10-CM | POA: Diagnosis not present

## 2018-06-17 DIAGNOSIS — R82991 Hypocitraturia: Secondary | ICD-10-CM | POA: Diagnosis not present

## 2018-06-17 DIAGNOSIS — J3089 Other allergic rhinitis: Secondary | ICD-10-CM | POA: Diagnosis not present

## 2018-06-17 DIAGNOSIS — J301 Allergic rhinitis due to pollen: Secondary | ICD-10-CM | POA: Diagnosis not present

## 2018-06-17 DIAGNOSIS — R82994 Hypercalciuria: Secondary | ICD-10-CM | POA: Diagnosis not present

## 2018-06-17 DIAGNOSIS — N200 Calculus of kidney: Secondary | ICD-10-CM | POA: Diagnosis not present

## 2018-06-18 DIAGNOSIS — J3089 Other allergic rhinitis: Secondary | ICD-10-CM | POA: Diagnosis not present

## 2018-06-22 DIAGNOSIS — J301 Allergic rhinitis due to pollen: Secondary | ICD-10-CM | POA: Diagnosis not present

## 2018-06-22 DIAGNOSIS — J3089 Other allergic rhinitis: Secondary | ICD-10-CM | POA: Diagnosis not present

## 2018-06-24 DIAGNOSIS — Z6822 Body mass index (BMI) 22.0-22.9, adult: Secondary | ICD-10-CM | POA: Diagnosis not present

## 2018-06-24 DIAGNOSIS — H60502 Unspecified acute noninfective otitis externa, left ear: Secondary | ICD-10-CM | POA: Diagnosis not present

## 2018-06-25 DIAGNOSIS — J301 Allergic rhinitis due to pollen: Secondary | ICD-10-CM | POA: Diagnosis not present

## 2018-06-30 DIAGNOSIS — J301 Allergic rhinitis due to pollen: Secondary | ICD-10-CM | POA: Diagnosis not present

## 2018-06-30 DIAGNOSIS — J3089 Other allergic rhinitis: Secondary | ICD-10-CM | POA: Diagnosis not present

## 2018-07-02 DIAGNOSIS — J301 Allergic rhinitis due to pollen: Secondary | ICD-10-CM | POA: Diagnosis not present

## 2018-07-02 DIAGNOSIS — J3089 Other allergic rhinitis: Secondary | ICD-10-CM | POA: Diagnosis not present

## 2018-07-06 DIAGNOSIS — J301 Allergic rhinitis due to pollen: Secondary | ICD-10-CM | POA: Diagnosis not present

## 2018-07-06 DIAGNOSIS — J3089 Other allergic rhinitis: Secondary | ICD-10-CM | POA: Diagnosis not present

## 2018-07-09 DIAGNOSIS — M1712 Unilateral primary osteoarthritis, left knee: Secondary | ICD-10-CM | POA: Diagnosis not present

## 2018-07-10 DIAGNOSIS — J3089 Other allergic rhinitis: Secondary | ICD-10-CM | POA: Diagnosis not present

## 2018-07-10 DIAGNOSIS — J301 Allergic rhinitis due to pollen: Secondary | ICD-10-CM | POA: Diagnosis not present

## 2018-07-13 DIAGNOSIS — J3089 Other allergic rhinitis: Secondary | ICD-10-CM | POA: Diagnosis not present

## 2018-07-13 DIAGNOSIS — J301 Allergic rhinitis due to pollen: Secondary | ICD-10-CM | POA: Diagnosis not present

## 2018-07-17 DIAGNOSIS — J301 Allergic rhinitis due to pollen: Secondary | ICD-10-CM | POA: Diagnosis not present

## 2018-07-17 DIAGNOSIS — J3089 Other allergic rhinitis: Secondary | ICD-10-CM | POA: Diagnosis not present

## 2018-07-20 DIAGNOSIS — J3089 Other allergic rhinitis: Secondary | ICD-10-CM | POA: Diagnosis not present

## 2018-07-20 DIAGNOSIS — J301 Allergic rhinitis due to pollen: Secondary | ICD-10-CM | POA: Diagnosis not present

## 2018-07-24 DIAGNOSIS — J3089 Other allergic rhinitis: Secondary | ICD-10-CM | POA: Diagnosis not present

## 2018-07-27 DIAGNOSIS — J301 Allergic rhinitis due to pollen: Secondary | ICD-10-CM | POA: Diagnosis not present

## 2018-07-27 DIAGNOSIS — J3089 Other allergic rhinitis: Secondary | ICD-10-CM | POA: Diagnosis not present

## 2018-07-30 DIAGNOSIS — J3089 Other allergic rhinitis: Secondary | ICD-10-CM | POA: Diagnosis not present

## 2018-08-03 DIAGNOSIS — J301 Allergic rhinitis due to pollen: Secondary | ICD-10-CM | POA: Diagnosis not present

## 2018-08-03 DIAGNOSIS — J3089 Other allergic rhinitis: Secondary | ICD-10-CM | POA: Diagnosis not present

## 2018-08-19 DIAGNOSIS — M1712 Unilateral primary osteoarthritis, left knee: Secondary | ICD-10-CM | POA: Diagnosis not present

## 2018-08-19 DIAGNOSIS — J301 Allergic rhinitis due to pollen: Secondary | ICD-10-CM | POA: Diagnosis not present

## 2018-08-19 DIAGNOSIS — J3089 Other allergic rhinitis: Secondary | ICD-10-CM | POA: Diagnosis not present

## 2018-08-26 DIAGNOSIS — M1712 Unilateral primary osteoarthritis, left knee: Secondary | ICD-10-CM | POA: Diagnosis not present

## 2018-08-27 DIAGNOSIS — J3089 Other allergic rhinitis: Secondary | ICD-10-CM | POA: Diagnosis not present

## 2018-08-27 DIAGNOSIS — J301 Allergic rhinitis due to pollen: Secondary | ICD-10-CM | POA: Diagnosis not present

## 2018-09-02 DIAGNOSIS — J3089 Other allergic rhinitis: Secondary | ICD-10-CM | POA: Diagnosis not present

## 2018-09-02 DIAGNOSIS — J301 Allergic rhinitis due to pollen: Secondary | ICD-10-CM | POA: Diagnosis not present

## 2018-09-02 DIAGNOSIS — M1712 Unilateral primary osteoarthritis, left knee: Secondary | ICD-10-CM | POA: Diagnosis not present

## 2018-09-03 DIAGNOSIS — F4320 Adjustment disorder, unspecified: Secondary | ICD-10-CM | POA: Diagnosis not present

## 2018-09-09 DIAGNOSIS — J301 Allergic rhinitis due to pollen: Secondary | ICD-10-CM | POA: Diagnosis not present

## 2018-09-11 DIAGNOSIS — J301 Allergic rhinitis due to pollen: Secondary | ICD-10-CM | POA: Diagnosis not present

## 2018-09-11 DIAGNOSIS — F4320 Adjustment disorder, unspecified: Secondary | ICD-10-CM | POA: Diagnosis not present

## 2018-09-11 DIAGNOSIS — J3089 Other allergic rhinitis: Secondary | ICD-10-CM | POA: Diagnosis not present

## 2018-09-14 DIAGNOSIS — Z23 Encounter for immunization: Secondary | ICD-10-CM | POA: Diagnosis not present

## 2018-09-16 DIAGNOSIS — J301 Allergic rhinitis due to pollen: Secondary | ICD-10-CM | POA: Diagnosis not present

## 2018-09-16 DIAGNOSIS — E7849 Other hyperlipidemia: Secondary | ICD-10-CM | POA: Diagnosis not present

## 2018-09-16 DIAGNOSIS — J3089 Other allergic rhinitis: Secondary | ICD-10-CM | POA: Diagnosis not present

## 2018-09-16 DIAGNOSIS — M810 Age-related osteoporosis without current pathological fracture: Secondary | ICD-10-CM | POA: Diagnosis not present

## 2018-09-16 DIAGNOSIS — Z0000 Encounter for general adult medical examination without abnormal findings: Secondary | ICD-10-CM | POA: Diagnosis not present

## 2018-09-21 DIAGNOSIS — H2513 Age-related nuclear cataract, bilateral: Secondary | ICD-10-CM | POA: Diagnosis not present

## 2018-09-23 DIAGNOSIS — M810 Age-related osteoporosis without current pathological fracture: Secondary | ICD-10-CM | POA: Diagnosis not present

## 2018-09-23 DIAGNOSIS — E7849 Other hyperlipidemia: Secondary | ICD-10-CM | POA: Diagnosis not present

## 2018-09-23 DIAGNOSIS — Z1389 Encounter for screening for other disorder: Secondary | ICD-10-CM | POA: Diagnosis not present

## 2018-09-23 DIAGNOSIS — Z0000 Encounter for general adult medical examination without abnormal findings: Secondary | ICD-10-CM | POA: Diagnosis not present

## 2018-09-23 DIAGNOSIS — L658 Other specified nonscarring hair loss: Secondary | ICD-10-CM | POA: Diagnosis not present

## 2018-09-24 DIAGNOSIS — J3089 Other allergic rhinitis: Secondary | ICD-10-CM | POA: Diagnosis not present

## 2018-09-24 DIAGNOSIS — Z1212 Encounter for screening for malignant neoplasm of rectum: Secondary | ICD-10-CM | POA: Diagnosis not present

## 2018-09-24 DIAGNOSIS — J301 Allergic rhinitis due to pollen: Secondary | ICD-10-CM | POA: Diagnosis not present

## 2018-09-28 DIAGNOSIS — J301 Allergic rhinitis due to pollen: Secondary | ICD-10-CM | POA: Diagnosis not present

## 2018-09-28 DIAGNOSIS — J3089 Other allergic rhinitis: Secondary | ICD-10-CM | POA: Diagnosis not present

## 2018-10-08 DIAGNOSIS — J3089 Other allergic rhinitis: Secondary | ICD-10-CM | POA: Diagnosis not present

## 2018-10-08 DIAGNOSIS — J301 Allergic rhinitis due to pollen: Secondary | ICD-10-CM | POA: Diagnosis not present

## 2018-10-16 DIAGNOSIS — J3089 Other allergic rhinitis: Secondary | ICD-10-CM | POA: Diagnosis not present

## 2018-10-16 DIAGNOSIS — J301 Allergic rhinitis due to pollen: Secondary | ICD-10-CM | POA: Diagnosis not present

## 2018-10-28 DIAGNOSIS — J301 Allergic rhinitis due to pollen: Secondary | ICD-10-CM | POA: Diagnosis not present

## 2018-10-30 DIAGNOSIS — J3089 Other allergic rhinitis: Secondary | ICD-10-CM | POA: Diagnosis not present

## 2018-10-30 DIAGNOSIS — J301 Allergic rhinitis due to pollen: Secondary | ICD-10-CM | POA: Diagnosis not present

## 2018-11-04 DIAGNOSIS — J3089 Other allergic rhinitis: Secondary | ICD-10-CM | POA: Diagnosis not present

## 2018-11-04 DIAGNOSIS — J301 Allergic rhinitis due to pollen: Secondary | ICD-10-CM | POA: Diagnosis not present

## 2018-11-11 DIAGNOSIS — J301 Allergic rhinitis due to pollen: Secondary | ICD-10-CM | POA: Diagnosis not present

## 2018-11-11 DIAGNOSIS — R05 Cough: Secondary | ICD-10-CM | POA: Diagnosis not present

## 2018-11-11 DIAGNOSIS — J3089 Other allergic rhinitis: Secondary | ICD-10-CM | POA: Diagnosis not present

## 2018-11-11 DIAGNOSIS — J3081 Allergic rhinitis due to animal (cat) (dog) hair and dander: Secondary | ICD-10-CM | POA: Diagnosis not present

## 2018-11-16 DIAGNOSIS — N3289 Other specified disorders of bladder: Secondary | ICD-10-CM | POA: Diagnosis not present

## 2018-11-20 DIAGNOSIS — J3089 Other allergic rhinitis: Secondary | ICD-10-CM | POA: Diagnosis not present

## 2018-11-20 DIAGNOSIS — J301 Allergic rhinitis due to pollen: Secondary | ICD-10-CM | POA: Diagnosis not present

## 2018-11-27 DIAGNOSIS — J301 Allergic rhinitis due to pollen: Secondary | ICD-10-CM | POA: Diagnosis not present

## 2018-11-27 DIAGNOSIS — J3089 Other allergic rhinitis: Secondary | ICD-10-CM | POA: Diagnosis not present

## 2018-11-30 DIAGNOSIS — J301 Allergic rhinitis due to pollen: Secondary | ICD-10-CM | POA: Diagnosis not present

## 2018-11-30 DIAGNOSIS — J3089 Other allergic rhinitis: Secondary | ICD-10-CM | POA: Diagnosis not present

## 2018-12-07 DIAGNOSIS — J3089 Other allergic rhinitis: Secondary | ICD-10-CM | POA: Diagnosis not present

## 2018-12-10 DIAGNOSIS — J301 Allergic rhinitis due to pollen: Secondary | ICD-10-CM | POA: Diagnosis not present

## 2018-12-10 DIAGNOSIS — J3089 Other allergic rhinitis: Secondary | ICD-10-CM | POA: Diagnosis not present

## 2018-12-16 DIAGNOSIS — H16141 Punctate keratitis, right eye: Secondary | ICD-10-CM | POA: Diagnosis not present

## 2018-12-16 DIAGNOSIS — H16101 Unspecified superficial keratitis, right eye: Secondary | ICD-10-CM | POA: Diagnosis not present

## 2018-12-18 DIAGNOSIS — J301 Allergic rhinitis due to pollen: Secondary | ICD-10-CM | POA: Diagnosis not present

## 2018-12-18 DIAGNOSIS — J3081 Allergic rhinitis due to animal (cat) (dog) hair and dander: Secondary | ICD-10-CM | POA: Diagnosis not present

## 2018-12-24 DIAGNOSIS — J3089 Other allergic rhinitis: Secondary | ICD-10-CM | POA: Diagnosis not present

## 2018-12-24 DIAGNOSIS — J301 Allergic rhinitis due to pollen: Secondary | ICD-10-CM | POA: Diagnosis not present

## 2018-12-24 DIAGNOSIS — M7062 Trochanteric bursitis, left hip: Secondary | ICD-10-CM | POA: Diagnosis not present

## 2018-12-24 DIAGNOSIS — M1712 Unilateral primary osteoarthritis, left knee: Secondary | ICD-10-CM | POA: Diagnosis not present

## 2018-12-29 DIAGNOSIS — N200 Calculus of kidney: Secondary | ICD-10-CM | POA: Diagnosis not present

## 2018-12-30 DIAGNOSIS — M7062 Trochanteric bursitis, left hip: Secondary | ICD-10-CM | POA: Diagnosis not present

## 2018-12-31 DIAGNOSIS — J3089 Other allergic rhinitis: Secondary | ICD-10-CM | POA: Diagnosis not present

## 2018-12-31 DIAGNOSIS — J301 Allergic rhinitis due to pollen: Secondary | ICD-10-CM | POA: Diagnosis not present

## 2019-01-01 DIAGNOSIS — M7062 Trochanteric bursitis, left hip: Secondary | ICD-10-CM | POA: Diagnosis not present

## 2019-01-04 DIAGNOSIS — M7062 Trochanteric bursitis, left hip: Secondary | ICD-10-CM | POA: Diagnosis not present

## 2019-01-04 DIAGNOSIS — J3089 Other allergic rhinitis: Secondary | ICD-10-CM | POA: Diagnosis not present

## 2019-01-04 DIAGNOSIS — J301 Allergic rhinitis due to pollen: Secondary | ICD-10-CM | POA: Diagnosis not present

## 2019-01-07 DIAGNOSIS — M7062 Trochanteric bursitis, left hip: Secondary | ICD-10-CM | POA: Diagnosis not present

## 2019-01-11 DIAGNOSIS — M7062 Trochanteric bursitis, left hip: Secondary | ICD-10-CM | POA: Diagnosis not present

## 2019-01-15 DIAGNOSIS — J3089 Other allergic rhinitis: Secondary | ICD-10-CM | POA: Diagnosis not present

## 2019-01-15 DIAGNOSIS — J301 Allergic rhinitis due to pollen: Secondary | ICD-10-CM | POA: Diagnosis not present

## 2019-01-18 DIAGNOSIS — M7062 Trochanteric bursitis, left hip: Secondary | ICD-10-CM | POA: Diagnosis not present

## 2019-01-20 DIAGNOSIS — M25551 Pain in right hip: Secondary | ICD-10-CM | POA: Diagnosis not present

## 2019-01-20 DIAGNOSIS — M7061 Trochanteric bursitis, right hip: Secondary | ICD-10-CM | POA: Diagnosis not present

## 2019-01-20 DIAGNOSIS — M25552 Pain in left hip: Secondary | ICD-10-CM | POA: Diagnosis not present

## 2019-01-21 DIAGNOSIS — M7062 Trochanteric bursitis, left hip: Secondary | ICD-10-CM | POA: Diagnosis not present

## 2019-01-22 DIAGNOSIS — J301 Allergic rhinitis due to pollen: Secondary | ICD-10-CM | POA: Diagnosis not present

## 2019-01-22 DIAGNOSIS — J3089 Other allergic rhinitis: Secondary | ICD-10-CM | POA: Diagnosis not present

## 2019-01-25 DIAGNOSIS — J301 Allergic rhinitis due to pollen: Secondary | ICD-10-CM | POA: Diagnosis not present

## 2019-01-25 DIAGNOSIS — J3089 Other allergic rhinitis: Secondary | ICD-10-CM | POA: Diagnosis not present

## 2019-01-25 DIAGNOSIS — M7062 Trochanteric bursitis, left hip: Secondary | ICD-10-CM | POA: Diagnosis not present

## 2019-01-27 DIAGNOSIS — M7062 Trochanteric bursitis, left hip: Secondary | ICD-10-CM | POA: Diagnosis not present

## 2019-02-05 DIAGNOSIS — J3089 Other allergic rhinitis: Secondary | ICD-10-CM | POA: Diagnosis not present

## 2019-02-05 DIAGNOSIS — J301 Allergic rhinitis due to pollen: Secondary | ICD-10-CM | POA: Diagnosis not present

## 2019-02-08 DIAGNOSIS — M7062 Trochanteric bursitis, left hip: Secondary | ICD-10-CM | POA: Diagnosis not present

## 2019-02-10 DIAGNOSIS — J3089 Other allergic rhinitis: Secondary | ICD-10-CM | POA: Diagnosis not present

## 2019-02-10 DIAGNOSIS — J301 Allergic rhinitis due to pollen: Secondary | ICD-10-CM | POA: Diagnosis not present

## 2019-02-10 DIAGNOSIS — M7062 Trochanteric bursitis, left hip: Secondary | ICD-10-CM | POA: Diagnosis not present

## 2019-02-10 DIAGNOSIS — Z01419 Encounter for gynecological examination (general) (routine) without abnormal findings: Secondary | ICD-10-CM | POA: Diagnosis not present

## 2019-02-10 DIAGNOSIS — Z6821 Body mass index (BMI) 21.0-21.9, adult: Secondary | ICD-10-CM | POA: Diagnosis not present

## 2019-02-10 DIAGNOSIS — Z1231 Encounter for screening mammogram for malignant neoplasm of breast: Secondary | ICD-10-CM | POA: Diagnosis not present

## 2019-02-17 DIAGNOSIS — J3089 Other allergic rhinitis: Secondary | ICD-10-CM | POA: Diagnosis not present

## 2019-02-17 DIAGNOSIS — J301 Allergic rhinitis due to pollen: Secondary | ICD-10-CM | POA: Diagnosis not present

## 2019-02-25 DIAGNOSIS — J301 Allergic rhinitis due to pollen: Secondary | ICD-10-CM | POA: Diagnosis not present

## 2019-02-25 DIAGNOSIS — J3089 Other allergic rhinitis: Secondary | ICD-10-CM | POA: Diagnosis not present

## 2019-03-04 DIAGNOSIS — J3089 Other allergic rhinitis: Secondary | ICD-10-CM | POA: Diagnosis not present

## 2019-03-04 DIAGNOSIS — J301 Allergic rhinitis due to pollen: Secondary | ICD-10-CM | POA: Diagnosis not present

## 2019-03-11 DIAGNOSIS — J301 Allergic rhinitis due to pollen: Secondary | ICD-10-CM | POA: Diagnosis not present

## 2019-03-11 DIAGNOSIS — J3089 Other allergic rhinitis: Secondary | ICD-10-CM | POA: Diagnosis not present

## 2019-03-18 DIAGNOSIS — J301 Allergic rhinitis due to pollen: Secondary | ICD-10-CM | POA: Diagnosis not present

## 2019-03-18 DIAGNOSIS — J3089 Other allergic rhinitis: Secondary | ICD-10-CM | POA: Diagnosis not present

## 2019-03-25 DIAGNOSIS — J301 Allergic rhinitis due to pollen: Secondary | ICD-10-CM | POA: Diagnosis not present

## 2019-03-25 DIAGNOSIS — J3089 Other allergic rhinitis: Secondary | ICD-10-CM | POA: Diagnosis not present

## 2019-03-29 DIAGNOSIS — D225 Melanocytic nevi of trunk: Secondary | ICD-10-CM | POA: Diagnosis not present

## 2019-03-29 DIAGNOSIS — D2372 Other benign neoplasm of skin of left lower limb, including hip: Secondary | ICD-10-CM | POA: Diagnosis not present

## 2019-03-29 DIAGNOSIS — D2262 Melanocytic nevi of left upper limb, including shoulder: Secondary | ICD-10-CM | POA: Diagnosis not present

## 2019-03-31 DIAGNOSIS — J3089 Other allergic rhinitis: Secondary | ICD-10-CM | POA: Diagnosis not present

## 2019-03-31 DIAGNOSIS — J301 Allergic rhinitis due to pollen: Secondary | ICD-10-CM | POA: Diagnosis not present

## 2019-04-01 DIAGNOSIS — M1712 Unilateral primary osteoarthritis, left knee: Secondary | ICD-10-CM | POA: Diagnosis not present

## 2019-04-06 DIAGNOSIS — J301 Allergic rhinitis due to pollen: Secondary | ICD-10-CM | POA: Diagnosis not present

## 2019-04-06 DIAGNOSIS — J3089 Other allergic rhinitis: Secondary | ICD-10-CM | POA: Diagnosis not present

## 2019-04-07 DIAGNOSIS — J301 Allergic rhinitis due to pollen: Secondary | ICD-10-CM | POA: Diagnosis not present

## 2019-04-08 DIAGNOSIS — M1712 Unilateral primary osteoarthritis, left knee: Secondary | ICD-10-CM | POA: Diagnosis not present

## 2019-04-14 DIAGNOSIS — J3089 Other allergic rhinitis: Secondary | ICD-10-CM | POA: Diagnosis not present

## 2019-04-14 DIAGNOSIS — J301 Allergic rhinitis due to pollen: Secondary | ICD-10-CM | POA: Diagnosis not present

## 2019-04-15 DIAGNOSIS — M170 Bilateral primary osteoarthritis of knee: Secondary | ICD-10-CM | POA: Diagnosis not present

## 2019-04-15 DIAGNOSIS — M1712 Unilateral primary osteoarthritis, left knee: Secondary | ICD-10-CM | POA: Diagnosis not present

## 2019-04-15 DIAGNOSIS — M1711 Unilateral primary osteoarthritis, right knee: Secondary | ICD-10-CM | POA: Diagnosis not present

## 2019-04-22 DIAGNOSIS — J301 Allergic rhinitis due to pollen: Secondary | ICD-10-CM | POA: Diagnosis not present

## 2019-04-22 DIAGNOSIS — J3089 Other allergic rhinitis: Secondary | ICD-10-CM | POA: Diagnosis not present

## 2019-04-29 DIAGNOSIS — J301 Allergic rhinitis due to pollen: Secondary | ICD-10-CM | POA: Diagnosis not present

## 2019-04-29 DIAGNOSIS — J3089 Other allergic rhinitis: Secondary | ICD-10-CM | POA: Diagnosis not present

## 2019-05-07 DIAGNOSIS — J301 Allergic rhinitis due to pollen: Secondary | ICD-10-CM | POA: Diagnosis not present

## 2019-05-07 DIAGNOSIS — J3089 Other allergic rhinitis: Secondary | ICD-10-CM | POA: Diagnosis not present

## 2019-05-10 DIAGNOSIS — J301 Allergic rhinitis due to pollen: Secondary | ICD-10-CM | POA: Diagnosis not present

## 2019-05-10 DIAGNOSIS — J3089 Other allergic rhinitis: Secondary | ICD-10-CM | POA: Diagnosis not present

## 2019-05-12 DIAGNOSIS — J301 Allergic rhinitis due to pollen: Secondary | ICD-10-CM | POA: Diagnosis not present

## 2019-05-12 DIAGNOSIS — J3089 Other allergic rhinitis: Secondary | ICD-10-CM | POA: Diagnosis not present

## 2019-05-13 DIAGNOSIS — J3089 Other allergic rhinitis: Secondary | ICD-10-CM | POA: Diagnosis not present

## 2019-05-17 DIAGNOSIS — J3089 Other allergic rhinitis: Secondary | ICD-10-CM | POA: Diagnosis not present

## 2019-05-17 DIAGNOSIS — J301 Allergic rhinitis due to pollen: Secondary | ICD-10-CM | POA: Diagnosis not present

## 2019-05-31 DIAGNOSIS — J301 Allergic rhinitis due to pollen: Secondary | ICD-10-CM | POA: Diagnosis not present

## 2019-05-31 DIAGNOSIS — J3089 Other allergic rhinitis: Secondary | ICD-10-CM | POA: Diagnosis not present

## 2019-06-02 DIAGNOSIS — J301 Allergic rhinitis due to pollen: Secondary | ICD-10-CM | POA: Diagnosis not present

## 2019-06-02 DIAGNOSIS — J3089 Other allergic rhinitis: Secondary | ICD-10-CM | POA: Diagnosis not present

## 2019-06-07 DIAGNOSIS — J301 Allergic rhinitis due to pollen: Secondary | ICD-10-CM | POA: Diagnosis not present

## 2019-06-07 DIAGNOSIS — J3089 Other allergic rhinitis: Secondary | ICD-10-CM | POA: Diagnosis not present

## 2019-06-09 DIAGNOSIS — J3089 Other allergic rhinitis: Secondary | ICD-10-CM | POA: Diagnosis not present

## 2019-06-09 DIAGNOSIS — N200 Calculus of kidney: Secondary | ICD-10-CM | POA: Diagnosis not present

## 2019-06-14 DIAGNOSIS — J3089 Other allergic rhinitis: Secondary | ICD-10-CM | POA: Diagnosis not present

## 2019-06-14 DIAGNOSIS — J301 Allergic rhinitis due to pollen: Secondary | ICD-10-CM | POA: Diagnosis not present

## 2019-06-16 DIAGNOSIS — D122 Benign neoplasm of ascending colon: Secondary | ICD-10-CM | POA: Diagnosis not present

## 2019-06-16 DIAGNOSIS — Z86010 Personal history of colonic polyps: Secondary | ICD-10-CM | POA: Diagnosis not present

## 2019-06-21 DIAGNOSIS — J3089 Other allergic rhinitis: Secondary | ICD-10-CM | POA: Diagnosis not present

## 2019-06-21 DIAGNOSIS — J301 Allergic rhinitis due to pollen: Secondary | ICD-10-CM | POA: Diagnosis not present

## 2019-06-30 DIAGNOSIS — J3089 Other allergic rhinitis: Secondary | ICD-10-CM | POA: Diagnosis not present

## 2019-06-30 DIAGNOSIS — N200 Calculus of kidney: Secondary | ICD-10-CM | POA: Diagnosis not present

## 2019-06-30 DIAGNOSIS — J301 Allergic rhinitis due to pollen: Secondary | ICD-10-CM | POA: Diagnosis not present

## 2019-07-07 DIAGNOSIS — J301 Allergic rhinitis due to pollen: Secondary | ICD-10-CM | POA: Diagnosis not present

## 2019-07-07 DIAGNOSIS — J3089 Other allergic rhinitis: Secondary | ICD-10-CM | POA: Diagnosis not present

## 2019-07-15 DIAGNOSIS — J301 Allergic rhinitis due to pollen: Secondary | ICD-10-CM | POA: Diagnosis not present

## 2019-07-15 DIAGNOSIS — J3089 Other allergic rhinitis: Secondary | ICD-10-CM | POA: Diagnosis not present

## 2019-07-16 DIAGNOSIS — N200 Calculus of kidney: Secondary | ICD-10-CM | POA: Diagnosis not present

## 2019-07-20 DIAGNOSIS — J301 Allergic rhinitis due to pollen: Secondary | ICD-10-CM | POA: Diagnosis not present

## 2019-07-20 DIAGNOSIS — J3089 Other allergic rhinitis: Secondary | ICD-10-CM | POA: Diagnosis not present

## 2019-07-21 DIAGNOSIS — M25562 Pain in left knee: Secondary | ICD-10-CM | POA: Diagnosis not present

## 2019-07-21 DIAGNOSIS — M170 Bilateral primary osteoarthritis of knee: Secondary | ICD-10-CM | POA: Diagnosis not present

## 2019-07-21 DIAGNOSIS — M25561 Pain in right knee: Secondary | ICD-10-CM | POA: Diagnosis not present

## 2019-07-27 DIAGNOSIS — J3089 Other allergic rhinitis: Secondary | ICD-10-CM | POA: Diagnosis not present

## 2019-07-29 DIAGNOSIS — J301 Allergic rhinitis due to pollen: Secondary | ICD-10-CM | POA: Diagnosis not present

## 2019-07-29 DIAGNOSIS — J3089 Other allergic rhinitis: Secondary | ICD-10-CM | POA: Diagnosis not present

## 2019-08-04 DIAGNOSIS — J3089 Other allergic rhinitis: Secondary | ICD-10-CM | POA: Diagnosis not present

## 2019-08-04 DIAGNOSIS — J301 Allergic rhinitis due to pollen: Secondary | ICD-10-CM | POA: Diagnosis not present

## 2019-08-09 DIAGNOSIS — L821 Other seborrheic keratosis: Secondary | ICD-10-CM | POA: Diagnosis not present

## 2019-08-09 DIAGNOSIS — D2372 Other benign neoplasm of skin of left lower limb, including hip: Secondary | ICD-10-CM | POA: Diagnosis not present

## 2019-08-09 DIAGNOSIS — D1801 Hemangioma of skin and subcutaneous tissue: Secondary | ICD-10-CM | POA: Diagnosis not present

## 2019-08-12 DIAGNOSIS — J3089 Other allergic rhinitis: Secondary | ICD-10-CM | POA: Diagnosis not present

## 2019-08-12 DIAGNOSIS — J301 Allergic rhinitis due to pollen: Secondary | ICD-10-CM | POA: Diagnosis not present

## 2019-08-17 DIAGNOSIS — J3089 Other allergic rhinitis: Secondary | ICD-10-CM | POA: Diagnosis not present

## 2019-08-17 DIAGNOSIS — J301 Allergic rhinitis due to pollen: Secondary | ICD-10-CM | POA: Diagnosis not present

## 2019-08-19 DIAGNOSIS — J3089 Other allergic rhinitis: Secondary | ICD-10-CM | POA: Diagnosis not present

## 2019-08-21 DIAGNOSIS — Z23 Encounter for immunization: Secondary | ICD-10-CM | POA: Diagnosis not present

## 2019-08-23 DIAGNOSIS — J3089 Other allergic rhinitis: Secondary | ICD-10-CM | POA: Diagnosis not present

## 2019-08-23 DIAGNOSIS — J301 Allergic rhinitis due to pollen: Secondary | ICD-10-CM | POA: Diagnosis not present

## 2019-08-26 DIAGNOSIS — J301 Allergic rhinitis due to pollen: Secondary | ICD-10-CM | POA: Diagnosis not present

## 2019-08-26 DIAGNOSIS — J3089 Other allergic rhinitis: Secondary | ICD-10-CM | POA: Diagnosis not present

## 2019-09-01 DIAGNOSIS — J3089 Other allergic rhinitis: Secondary | ICD-10-CM | POA: Diagnosis not present

## 2019-09-01 DIAGNOSIS — J301 Allergic rhinitis due to pollen: Secondary | ICD-10-CM | POA: Diagnosis not present

## 2019-09-08 DIAGNOSIS — J3089 Other allergic rhinitis: Secondary | ICD-10-CM | POA: Diagnosis not present

## 2019-09-08 DIAGNOSIS — J301 Allergic rhinitis due to pollen: Secondary | ICD-10-CM | POA: Diagnosis not present

## 2019-09-14 DIAGNOSIS — J301 Allergic rhinitis due to pollen: Secondary | ICD-10-CM | POA: Diagnosis not present

## 2019-09-14 DIAGNOSIS — J3089 Other allergic rhinitis: Secondary | ICD-10-CM | POA: Diagnosis not present

## 2019-09-16 DIAGNOSIS — M67941 Unspecified disorder of synovium and tendon, right hand: Secondary | ICD-10-CM | POA: Diagnosis not present

## 2019-09-16 DIAGNOSIS — M67942 Unspecified disorder of synovium and tendon, left hand: Secondary | ICD-10-CM | POA: Diagnosis not present

## 2019-09-20 DIAGNOSIS — J301 Allergic rhinitis due to pollen: Secondary | ICD-10-CM | POA: Diagnosis not present

## 2019-09-20 DIAGNOSIS — J3089 Other allergic rhinitis: Secondary | ICD-10-CM | POA: Diagnosis not present

## 2019-09-23 DIAGNOSIS — J301 Allergic rhinitis due to pollen: Secondary | ICD-10-CM | POA: Diagnosis not present

## 2019-09-27 DIAGNOSIS — J3089 Other allergic rhinitis: Secondary | ICD-10-CM | POA: Diagnosis not present

## 2019-09-27 DIAGNOSIS — J301 Allergic rhinitis due to pollen: Secondary | ICD-10-CM | POA: Diagnosis not present

## 2019-09-28 DIAGNOSIS — M720 Palmar fascial fibromatosis [Dupuytren]: Secondary | ICD-10-CM | POA: Diagnosis not present

## 2019-09-28 DIAGNOSIS — H1045 Other chronic allergic conjunctivitis: Secondary | ICD-10-CM | POA: Diagnosis not present

## 2019-09-29 DIAGNOSIS — J301 Allergic rhinitis due to pollen: Secondary | ICD-10-CM | POA: Diagnosis not present

## 2019-09-29 DIAGNOSIS — J3089 Other allergic rhinitis: Secondary | ICD-10-CM | POA: Diagnosis not present

## 2019-10-04 DIAGNOSIS — J3089 Other allergic rhinitis: Secondary | ICD-10-CM | POA: Diagnosis not present

## 2019-10-04 DIAGNOSIS — J301 Allergic rhinitis due to pollen: Secondary | ICD-10-CM | POA: Diagnosis not present

## 2019-10-05 DIAGNOSIS — M810 Age-related osteoporosis without current pathological fracture: Secondary | ICD-10-CM | POA: Diagnosis not present

## 2019-10-05 DIAGNOSIS — E7849 Other hyperlipidemia: Secondary | ICD-10-CM | POA: Diagnosis not present

## 2019-10-05 DIAGNOSIS — Z0000 Encounter for general adult medical examination without abnormal findings: Secondary | ICD-10-CM | POA: Diagnosis not present

## 2019-10-08 DIAGNOSIS — J301 Allergic rhinitis due to pollen: Secondary | ICD-10-CM | POA: Diagnosis not present

## 2019-10-11 DIAGNOSIS — R82998 Other abnormal findings in urine: Secondary | ICD-10-CM | POA: Diagnosis not present

## 2019-10-12 DIAGNOSIS — Z87442 Personal history of urinary calculi: Secondary | ICD-10-CM | POA: Diagnosis not present

## 2019-10-12 DIAGNOSIS — M810 Age-related osteoporosis without current pathological fracture: Secondary | ICD-10-CM | POA: Diagnosis not present

## 2019-10-12 DIAGNOSIS — E785 Hyperlipidemia, unspecified: Secondary | ICD-10-CM | POA: Diagnosis not present

## 2019-10-12 DIAGNOSIS — M79673 Pain in unspecified foot: Secondary | ICD-10-CM | POA: Diagnosis not present

## 2019-10-12 DIAGNOSIS — Z1331 Encounter for screening for depression: Secondary | ICD-10-CM | POA: Diagnosis not present

## 2019-10-12 DIAGNOSIS — Z0000 Encounter for general adult medical examination without abnormal findings: Secondary | ICD-10-CM | POA: Diagnosis not present

## 2019-10-14 DIAGNOSIS — J3089 Other allergic rhinitis: Secondary | ICD-10-CM | POA: Diagnosis not present

## 2019-10-14 DIAGNOSIS — J301 Allergic rhinitis due to pollen: Secondary | ICD-10-CM | POA: Diagnosis not present

## 2019-10-19 DIAGNOSIS — M25552 Pain in left hip: Secondary | ICD-10-CM | POA: Diagnosis not present

## 2019-10-19 DIAGNOSIS — M7061 Trochanteric bursitis, right hip: Secondary | ICD-10-CM | POA: Diagnosis not present

## 2019-10-19 DIAGNOSIS — M7062 Trochanteric bursitis, left hip: Secondary | ICD-10-CM | POA: Diagnosis not present

## 2019-10-22 DIAGNOSIS — J301 Allergic rhinitis due to pollen: Secondary | ICD-10-CM | POA: Diagnosis not present

## 2019-10-22 DIAGNOSIS — J3089 Other allergic rhinitis: Secondary | ICD-10-CM | POA: Diagnosis not present

## 2019-11-16 DIAGNOSIS — J3089 Other allergic rhinitis: Secondary | ICD-10-CM | POA: Diagnosis not present

## 2019-11-16 DIAGNOSIS — J301 Allergic rhinitis due to pollen: Secondary | ICD-10-CM | POA: Diagnosis not present

## 2019-11-24 DIAGNOSIS — Z1212 Encounter for screening for malignant neoplasm of rectum: Secondary | ICD-10-CM | POA: Diagnosis not present

## 2019-11-24 DIAGNOSIS — J301 Allergic rhinitis due to pollen: Secondary | ICD-10-CM | POA: Diagnosis not present

## 2019-11-24 DIAGNOSIS — J3089 Other allergic rhinitis: Secondary | ICD-10-CM | POA: Diagnosis not present

## 2019-11-25 DIAGNOSIS — M25562 Pain in left knee: Secondary | ICD-10-CM | POA: Diagnosis not present

## 2019-11-25 DIAGNOSIS — M25561 Pain in right knee: Secondary | ICD-10-CM | POA: Diagnosis not present

## 2019-12-01 DIAGNOSIS — J3089 Other allergic rhinitis: Secondary | ICD-10-CM | POA: Diagnosis not present

## 2019-12-01 DIAGNOSIS — J301 Allergic rhinitis due to pollen: Secondary | ICD-10-CM | POA: Diagnosis not present

## 2019-12-08 DIAGNOSIS — J301 Allergic rhinitis due to pollen: Secondary | ICD-10-CM | POA: Diagnosis not present

## 2019-12-08 DIAGNOSIS — J3089 Other allergic rhinitis: Secondary | ICD-10-CM | POA: Diagnosis not present

## 2019-12-16 DIAGNOSIS — J301 Allergic rhinitis due to pollen: Secondary | ICD-10-CM | POA: Diagnosis not present

## 2019-12-16 DIAGNOSIS — J3089 Other allergic rhinitis: Secondary | ICD-10-CM | POA: Diagnosis not present

## 2019-12-22 DIAGNOSIS — J301 Allergic rhinitis due to pollen: Secondary | ICD-10-CM | POA: Diagnosis not present

## 2019-12-22 DIAGNOSIS — J3089 Other allergic rhinitis: Secondary | ICD-10-CM | POA: Diagnosis not present

## 2019-12-24 DIAGNOSIS — J301 Allergic rhinitis due to pollen: Secondary | ICD-10-CM | POA: Diagnosis not present

## 2019-12-27 DIAGNOSIS — J3089 Other allergic rhinitis: Secondary | ICD-10-CM | POA: Diagnosis not present

## 2019-12-30 DIAGNOSIS — J301 Allergic rhinitis due to pollen: Secondary | ICD-10-CM | POA: Diagnosis not present

## 2019-12-30 DIAGNOSIS — J3089 Other allergic rhinitis: Secondary | ICD-10-CM | POA: Diagnosis not present

## 2020-01-06 DIAGNOSIS — Z13820 Encounter for screening for osteoporosis: Secondary | ICD-10-CM | POA: Diagnosis not present

## 2020-01-07 DIAGNOSIS — J301 Allergic rhinitis due to pollen: Secondary | ICD-10-CM | POA: Diagnosis not present

## 2020-01-07 DIAGNOSIS — J3089 Other allergic rhinitis: Secondary | ICD-10-CM | POA: Diagnosis not present

## 2020-01-13 DIAGNOSIS — J301 Allergic rhinitis due to pollen: Secondary | ICD-10-CM | POA: Diagnosis not present

## 2020-01-13 DIAGNOSIS — J3089 Other allergic rhinitis: Secondary | ICD-10-CM | POA: Diagnosis not present

## 2020-02-01 DIAGNOSIS — F4320 Adjustment disorder, unspecified: Secondary | ICD-10-CM | POA: Diagnosis not present

## 2020-02-03 DIAGNOSIS — J3089 Other allergic rhinitis: Secondary | ICD-10-CM | POA: Diagnosis not present

## 2020-02-03 DIAGNOSIS — J301 Allergic rhinitis due to pollen: Secondary | ICD-10-CM | POA: Diagnosis not present

## 2020-02-09 DIAGNOSIS — M170 Bilateral primary osteoarthritis of knee: Secondary | ICD-10-CM | POA: Diagnosis not present

## 2020-02-09 DIAGNOSIS — J3081 Allergic rhinitis due to animal (cat) (dog) hair and dander: Secondary | ICD-10-CM | POA: Diagnosis not present

## 2020-02-09 DIAGNOSIS — J3089 Other allergic rhinitis: Secondary | ICD-10-CM | POA: Diagnosis not present

## 2020-02-09 DIAGNOSIS — M25561 Pain in right knee: Secondary | ICD-10-CM | POA: Diagnosis not present

## 2020-02-09 DIAGNOSIS — M25562 Pain in left knee: Secondary | ICD-10-CM | POA: Diagnosis not present

## 2020-02-09 DIAGNOSIS — J301 Allergic rhinitis due to pollen: Secondary | ICD-10-CM | POA: Diagnosis not present

## 2020-02-09 DIAGNOSIS — R05 Cough: Secondary | ICD-10-CM | POA: Diagnosis not present

## 2020-02-14 DIAGNOSIS — Z6821 Body mass index (BMI) 21.0-21.9, adult: Secondary | ICD-10-CM | POA: Diagnosis not present

## 2020-02-14 DIAGNOSIS — Z1231 Encounter for screening mammogram for malignant neoplasm of breast: Secondary | ICD-10-CM | POA: Diagnosis not present

## 2020-02-14 DIAGNOSIS — Z01419 Encounter for gynecological examination (general) (routine) without abnormal findings: Secondary | ICD-10-CM | POA: Diagnosis not present

## 2020-02-15 DIAGNOSIS — J3089 Other allergic rhinitis: Secondary | ICD-10-CM | POA: Diagnosis not present

## 2020-02-15 DIAGNOSIS — J301 Allergic rhinitis due to pollen: Secondary | ICD-10-CM | POA: Diagnosis not present

## 2020-02-15 DIAGNOSIS — H1033 Unspecified acute conjunctivitis, bilateral: Secondary | ICD-10-CM | POA: Diagnosis not present

## 2020-02-23 DIAGNOSIS — J301 Allergic rhinitis due to pollen: Secondary | ICD-10-CM | POA: Diagnosis not present

## 2020-02-23 DIAGNOSIS — J3089 Other allergic rhinitis: Secondary | ICD-10-CM | POA: Diagnosis not present

## 2021-10-02 ENCOUNTER — Other Ambulatory Visit: Payer: Self-pay | Admitting: Obstetrics and Gynecology

## 2021-10-02 DIAGNOSIS — M79621 Pain in right upper arm: Secondary | ICD-10-CM

## 2021-10-23 ENCOUNTER — Other Ambulatory Visit: Payer: Self-pay

## 2021-10-23 ENCOUNTER — Ambulatory Visit
Admission: RE | Admit: 2021-10-23 | Discharge: 2021-10-23 | Disposition: A | Discharge status: Home / Self Care | Payer: BLUE CROSS/BLUE SHIELD | Source: Ambulatory Visit | Attending: Obstetrics and Gynecology | Admitting: Obstetrics and Gynecology

## 2021-10-23 ENCOUNTER — Ambulatory Visit
Admission: RE | Admit: 2021-10-23 | Discharge: 2021-10-23 | Disposition: A | Discharge status: Home / Self Care | Payer: Medicare Other | Source: Ambulatory Visit | Attending: Obstetrics and Gynecology | Admitting: Obstetrics and Gynecology

## 2021-10-23 DIAGNOSIS — M79621 Pain in right upper arm: Secondary | ICD-10-CM

## 2021-11-08 ENCOUNTER — Other Ambulatory Visit: Payer: Self-pay | Admitting: Internal Medicine

## 2021-11-08 DIAGNOSIS — Z0000 Encounter for general adult medical examination without abnormal findings: Secondary | ICD-10-CM

## 2021-11-08 DIAGNOSIS — E785 Hyperlipidemia, unspecified: Secondary | ICD-10-CM

## 2021-12-12 ENCOUNTER — Ambulatory Visit
Admission: RE | Admit: 2021-12-12 | Discharge: 2021-12-12 | Disposition: A | Discharge status: Home / Self Care | Payer: No Typology Code available for payment source | Source: Ambulatory Visit | Attending: Internal Medicine | Admitting: Internal Medicine

## 2021-12-12 DIAGNOSIS — Z0000 Encounter for general adult medical examination without abnormal findings: Secondary | ICD-10-CM

## 2021-12-12 DIAGNOSIS — E785 Hyperlipidemia, unspecified: Secondary | ICD-10-CM

## 2022-04-02 ENCOUNTER — Telehealth: Payer: Self-pay

## 2022-04-02 NOTE — Telephone Encounter (Signed)
Left message for pt to call back to set up appointment with Dr. Gwenlyn Found. Self-referral ?

## 2022-04-16 ENCOUNTER — Encounter: Payer: Self-pay | Admitting: Cardiovascular Disease

## 2022-04-16 ENCOUNTER — Ambulatory Visit: Payer: Medicare Other | Admitting: Cardiovascular Disease

## 2022-04-16 DIAGNOSIS — E785 Hyperlipidemia, unspecified: Secondary | ICD-10-CM | POA: Insufficient documentation

## 2022-04-16 DIAGNOSIS — E782 Mixed hyperlipidemia: Secondary | ICD-10-CM | POA: Diagnosis not present

## 2022-04-16 DIAGNOSIS — R931 Abnormal findings on diagnostic imaging of heart and coronary circulation: Secondary | ICD-10-CM

## 2022-04-16 NOTE — Assessment & Plan Note (Signed)
Coronary calcium score performed 12/12/2021 was 343 total with majority being in the LAD.  She is very active and asymptomatic.  Based on this, we decided to aggressively treat her hyperlipidemia with an LDL goal of less than 70. ?

## 2022-04-16 NOTE — Patient Instructions (Addendum)
Medication Instructions:  ?Your physician recommends that you continue on your current medications as directed. Please refer to the Current Medication list given to you today. ? ?*If you need a refill on your cardiac medications before your next appointment, please call your pharmacy* ? ? ?Lab Work: ?Your physician recommends that you return for lab work in: 3 months for FASTING lipid/liver profile. ? ?If you have labs (blood work) drawn today and your tests are completely normal, you will receive your results only by: ?MyChart Message (if you have MyChart) OR ?A paper copy in the mail ?If you have any lab test that is abnormal or we need to change your treatment, we will call you to review the results. ? ? ? ?Follow-Up: ?At [facility], you and your health needs are our priority.  As part of our continuing mission to provide you with exceptional heart care, we have created designated Provider Care Teams.  These Care Teams include your primary Cardiologist (physician) and Advanced Practice Providers (APPs -  Physician Assistants and Nurse Practitioners) who all work together to provide you with the care you need, when you need it. ? ?We recommend signing up for the patient portal called "MyChart".  Sign up information is provided on this After Visit Summary.  MyChart is used to connect with patients for Virtual Visits (Telemedicine).  Patients are able to view lab/test results, encounter notes, upcoming appointments, etc.  Non-urgent messages can be sent to your provider as well.   ?To learn more about what you can do with MyChart, go to [email or web address].   ? ?Your next appointment:   ?6 month(s) ? ?The format for your next appointment:   ?In Person ? ?Provider:   ?Quay Burow, MD ?

## 2022-04-16 NOTE — Assessment & Plan Note (Signed)
History of hyperlipidemia with lipid profile performed 10/29/2021 revealing a total cholesterol of 236, LDL of 139 and HDL of 83.  She was begun on a statin drug by her primary care physician resulting in severe arthralgias.  Her statin was changed to an another statin, rosuvastatin, 5 mg daily which she is currently tolerating.  We will recheck a lipid liver profile in 3 months.  Her LDL goal for secondary prevention is less than 70 given her moderately elevated coronary calcium score. ?

## 2022-04-16 NOTE — Progress Notes (Signed)
? ? ? ?04/16/2022 ?Vanessa Diaz   ?Apr 13, 1956  ?[id number] ? ?Primary Physician Velna Hatchet, MD ?Primary Cardiologist: Lorretta Harp MD Lupe Carney, [place] ? ?HPI:  Vanessa Diaz is a 66 y.o. thin and fit appearing married Caucasian female mother of 68, grandmother of 5 grandchildren referred by her PCP, Dr. Ardeth Perfect, for elevated coronary calcium score.  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  Her only cardiac risk factor is hyperlipidemia.  There is no family history for heart disease.  She is never had heart attack or stroke.  She denies chest pain or shortness of breath.  She is very active and works out with a Clinical research associate.  She did have a coronary calcium score performed 12/12/1961 which was 343 total the majority of which was in the LAD.  Based on this, her primary care physician began her on atorvastatin which resulted in myalgias and arthralgias despite a reduced dose and she was changed to rosuvastatin which she is currently tolerating. ? ? ?Current Meds  ?Medication Sig  ? chlorthalidone (HYGROTEN) 15 MG tablet Take 7.5 mg by mouth daily.  ? denosumab (PROLIA) 60 MG/ML SOSY injection Inject 60 mg into the skin every 6 (six) months.  ? fluticasone (FLONASE) 50 MCG/ACT nasal spray Place 2 sprays into both nostrils daily.  ? glucosamine-chondroitin 500-400 MG tablet Take 1 tablet by mouth daily.   ? ibuprofen (ADVIL,MOTRIN) 200 MG tablet Take 800 mg by mouth every 8 (eight) hours as needed. For pain.   ? Montelukast Sodium (SINGULAIR PO) Take 1 tablet by mouth daily.  ? potassium citrate (UROCIT-K) 10 MEQ (1080 MG) SR tablet Take 10 mEq by mouth 2 (two) times daily.  ? pyridOXINE (VITAMIN B-6) 100 MG tablet Take 100 mg by mouth daily.  ? vitamin E 100 UNIT capsule Take 100 Units by mouth daily.    ? [DISCONTINUED] calcium-vitamin D (OSCAL WITH D) 500-200 MG-UNIT per tablet Take 1 tablet by mouth daily.    ?  ? ?No Known Allergies ? ?Social History  ? ?Socioeconomic  History  ? Marital status: Married  ?  Spouse name: Not on file  ? Number of children: Not on file  ? Years of education: Not on file  ? Highest education level: Not on file  ?Occupational History  ? Not on file  ?Tobacco Use  ? Smoking status: Never  ? Smokeless tobacco: Not on file  ?Substance and Sexual Activity  ? Alcohol use: Yes  ? Drug use: No  ? Sexual activity: Not on file  ?Other Topics Concern  ? Not on file  ?Social History Narrative  ? Not on file  ? ?Social Determinants of Health  ? ?Financial Resource Strain: Not on file  ?Food Insecurity: Not on file  ?Transportation Needs: Not on file  ?Physical Activity: Not on file  ?Stress: Not on file  ?Social Connections: Not on file  ?Intimate Partner Violence: Not on file  ?  ? ?Review of Systems: ?General: negative for chills, fever, night sweats or weight changes.  ?Cardiovascular: negative for chest pain, dyspnea on exertion, edema, orthopnea, palpitations, paroxysmal nocturnal dyspnea or shortness of breath ?Dermatological: negative for rash ?Respiratory: negative for cough or wheezing ?Urologic: negative for hematuria ?Abdominal: negative for nausea, vomiting, diarrhea, bright red blood per rectum, melena, or hematemesis ?Neurologic: negative for visual changes, syncope, or dizziness ?All other systems reviewed and are otherwise negative except as noted above. ? ? ? ?Blood  pressure 132/68, pulse 60, height '5\' 7"'$  (1.702 m), weight 140 lb (63.5 kg).  ?General appearance: alert and no distress ?Neck: no adenopathy, no carotid bruit, no JVD, supple, symmetrical, trachea midline, and thyroid not enlarged, symmetric, no tenderness/mass/nodules ?Lungs: clear to auscultation bilaterally ?Heart: regular rate and rhythm, S1, S2 normal, no murmur, click, rub or gallop ?Extremities: extremities normal, atraumatic, no cyanosis or edema ?Pulses: 2+ and symmetric ?Skin: Skin color, texture, turgor normal. No rashes or lesions ?Neurologic: Grossly normal ? ?EKG sinus  rhythm at 60 with RSR prime in lead V1 and V2 consistent with RV conduction delay.  I personally reviewed this EKG. ? ?ASSESSMENT AND PLAN:  ? ?Hyperlipidemia ?History of hyperlipidemia with lipid profile performed 10/29/2021 revealing a total cholesterol of 236, LDL of 139 and HDL of 83.  She was begun on a statin drug by her primary care physician resulting in severe arthralgias.  Her statin was changed to an another statin, rosuvastatin, 5 mg daily which she is currently tolerating.  We will recheck a lipid liver profile in 3 months.  Her LDL goal for secondary prevention is less than 70 given her moderately elevated coronary calcium score. ? ?Elevated coronary artery calcium score ?Coronary calcium score performed 12/12/2021 was 343 total with majority being in the LAD.  She is very active and asymptomatic.  Based on this, we decided to aggressively treat her hyperlipidemia with an LDL goal of less than 70. ? ? ? ? ?Lorretta Harp MD FACP,FACC,FAHA, FSCAI ?04/16/2022 ?2:10 PM ?

## 2022-08-06 LAB — HEPATIC FUNCTION PANEL
ALT: 22 IU/L (ref 0–32)
AST: 22 IU/L (ref 0–40)
Albumin: 4.4 g/dL (ref 3.9–4.9)
Alkaline Phosphatase: 50 IU/L (ref 44–121)
Bilirubin Total: 0.5 mg/dL (ref 0.0–1.2)
Bilirubin, Direct: 0.16 mg/dL (ref 0.00–0.40)
Total Protein: 6.7 g/dL (ref 6.0–8.5)

## 2022-08-06 LAB — LIPID PANEL
Chol/HDL Ratio: 2.1 ratio (ref 0.0–4.4)
Cholesterol, Total: 166 mg/dL (ref 100–199)
HDL: 80 mg/dL (ref >39)
LDL Chol Calc (NIH): 75 mg/dL (ref 0–99)
Triglycerides: 56 mg/dL (ref 0–149)
VLDL Cholesterol Cal: 11 mg/dL (ref 5–40)

## 2022-08-14 ENCOUNTER — Other Ambulatory Visit: Payer: Self-pay

## 2022-08-14 DIAGNOSIS — R931 Abnormal findings on diagnostic imaging of heart and coronary circulation: Secondary | ICD-10-CM

## 2022-08-14 DIAGNOSIS — E782 Mixed hyperlipidemia: Secondary | ICD-10-CM

## 2022-11-12 ENCOUNTER — Ambulatory Visit: Payer: Medicare Other | Attending: Cardiovascular Disease | Admitting: Cardiovascular Disease

## 2022-11-12 ENCOUNTER — Encounter: Payer: Self-pay | Admitting: Cardiovascular Disease

## 2022-11-12 VITALS — BP 116/68 | HR 71 | Ht 67.0 in | Wt 137.7 lb

## 2022-11-12 DIAGNOSIS — R931 Abnormal findings on diagnostic imaging of heart and coronary circulation: Secondary | ICD-10-CM

## 2022-11-12 DIAGNOSIS — E782 Mixed hyperlipidemia: Secondary | ICD-10-CM

## 2022-11-12 MED ORDER — ROSUVASTATIN CALCIUM 10 MG PO TABS: 10.0000 mg | ORAL_TABLET | Freq: Every day | ORAL | 3 refills | Status: DC

## 2022-11-12 NOTE — Progress Notes (Signed)
11/12/2022 Vanessa Diaz   Apr 15, 1956  [id number]  Primary Physician Velna Hatchet, MD Primary Cardiologist: Lorretta Harp MD Garret Reddish, [place], [place]  HPI:  Vanessa Diaz is a 66 y.o.  thin and fit appearing married Caucasian female mother of 60, grandmother of 5 grandchildren referred by her PCP, Dr. Ardeth Perfect, for elevated coronary calcium score.  I last saw her in the office 04/16/2022.  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 Her only cardiac risk factor is hyperlipidemia. There is no family history for heart disease. She is never had heart attack or stroke. She denies chest pain or shortness of breath. She is very active and works out with a Clinical research associate. She did have a coronary calcium score performed 12/12/1961 which was 343 total the majority of which was in the LAD. Based on this, her primary care physician began her on atorvastatin which resulted in myalgias and arthralgias despite a reduced dose and she was changed to rosuvastatin which she is currently tolerating.  Since I saw her 6 months ago she continues to do well.  She is still active working with a Clinical research associate.  She denies chest pain or shortness of breath.  Her LDL seem to have gone up over the last 3 months from 75 on 08/06/2022 up to 111/28/23.   Current Meds  Medication Sig   chlorthalidone (HYGROTEN) 15 MG tablet Take 7.5 mg by mouth daily.   denosumab (PROLIA) 60 MG/ML SOSY injection Inject 60 mg into the skin every 6 (six) months.   fluticasone (FLONASE) 50 MCG/ACT nasal spray Place 2 sprays into both nostrils daily.   glucosamine-chondroitin 500-400 MG tablet Take 1 tablet by mouth daily.    Montelukast Sodium (SINGULAIR PO) Take 1 tablet by mouth daily.   potassium citrate (UROCIT-K) 10 MEQ (1080 MG) SR tablet Take 10 mEq by mouth 2 (two) times daily.   pyridOXINE (VITAMIN B-6) 100 MG tablet Take 100 mg by mouth daily.   vitamin E 100 UNIT capsule Take 100 Units by mouth daily.      [DISCONTINUED] rosuvastatin (CRESTOR) 5 MG tablet Take 5 mg by mouth daily.     No Known Allergies  Social History   Socioeconomic History   Marital status: Married    Spouse name: Not on file   Number of children: Not on file   Years of education: Not on file   Highest education level: Not on file  Occupational History   Not on file  Tobacco Use   Smoking status: Never   Smokeless tobacco: Not on file  Substance and Sexual Activity   Alcohol use: Yes   Drug use: No   Sexual activity: Not on file  Other Topics Concern   Not on file  Social History Narrative   Not on file   Social Determinants of Health   Financial Resource Strain: Not on file  Food Insecurity: Not on file  Transportation Needs: Not on file  Physical Activity: Not on file  Stress: Not on file  Social Connections: Not on file  Intimate Partner Violence: Not on file     Review of Systems: General: negative for chills, fever, night sweats or weight changes.  Cardiovascular: negative for chest pain, dyspnea on exertion, edema, orthopnea, palpitations, paroxysmal nocturnal dyspnea or shortness of breath Dermatological: negative for rash Respiratory: negative for cough or wheezing Urologic: negative for hematuria Abdominal: negative for nausea, vomiting, diarrhea, bright red blood per rectum,  melena, or hematemesis Neurologic: negative for visual changes, syncope, or dizziness All other systems reviewed and are otherwise negative except as noted above.    Blood pressure 116/68, pulse 71, height '5\' 7"'$  (1.702 m), weight 137 lb 11.2 oz (62.5 kg), SpO2 97 %.  General appearance: alert and no distress Neck: no adenopathy, no carotid bruit, no JVD, supple, symmetrical, trachea midline, and thyroid not enlarged, symmetric, no tenderness/mass/nodules Lungs: clear to auscultation bilaterally Heart: regular rate and rhythm, S1, S2 normal, no murmur, click, rub or gallop Extremities: extremities normal,  atraumatic, no cyanosis or edema Pulses: 2+ and symmetric Skin: Skin color, texture, turgor normal. No rashes or lesions Neurologic: Grossly normal  EKG sinus rhythm at 71 with incomplete right bundle branch block.  I personally reviewed this EKG.  ASSESSMENT AND PLAN:   Hyperlipidemia History of hyperlipidemia on low-dose rosuvastatin with lipid profile recently performed performed by her PCP 10/28/2022 revealing total cholesterol 167, LDL 100 and HDL of 58.  This represents an increase over the last 3 months when her LDL was 75 on the same dose.  Her LDL goal is less than 70 given her elevated coronary calcium score.  I am going to increase her rosuvastatin from 5 to 10 mg a day and we will recheck a lipid liver profile in 3 months.  Elevated coronary artery calcium score Elevated coronary calcium score 343 performed 1//23 primarily in the LAD distribution.  She is very active and completely asymptomatic.     Lorretta Harp MD FACP,FACC,FAHA, [facility] 11/12/2022 1:46 PM

## 2022-11-12 NOTE — Assessment & Plan Note (Signed)
Elevated coronary calcium score 343 performed 1//23 primarily in the LAD distribution.  She is very active and completely asymptomatic.

## 2022-11-12 NOTE — Assessment & Plan Note (Signed)
History of hyperlipidemia on low-dose rosuvastatin with lipid profile recently performed performed by her PCP 10/28/2022 revealing total cholesterol 167, LDL 100 and HDL of 58.  This represents an increase over the last 3 months when her LDL was 75 on the same dose.  Her LDL goal is less than 70 given her elevated coronary calcium score.  I am going to increase her rosuvastatin from 5 to 10 mg a day and we will recheck a lipid liver profile in 3 months.

## 2022-11-12 NOTE — Patient Instructions (Signed)
Medication Instructions:  Your physician has recommended you make the following change in your medication:   -Increase rosuvastatin (crestor) to '10mg'$  once daily.  *If you need a refill on your cardiac medications before your next appointment, please call your pharmacy*   Lab Work: Your physician recommends that you return for lab work in: 3 month for FASTING lipid/liver panel.  If you have labs (blood work) drawn today and your tests are completely normal, you will receive your results only by: [place] (if you have MyChart) OR A paper copy in the mail If you have any lab test that is abnormal or we need to change your treatment, we will call you to review the results.   Follow-Up: At [facility], you and your health needs are our priority.  As part of our continuing mission to provide you with exceptional heart care, we have created designated Provider Care Teams.  These Care Teams include your primary Cardiologist (physician) and Advanced Practice Providers (APPs -  Physician Assistants and Nurse Practitioners) who all work together to provide you with the care you need, when you need it.  We recommend signing up for the patient portal called "MyChart".  Sign up information is provided on this After Visit Summary.  MyChart is used to connect with patients for Virtual Visits (Telemedicine).  Patients are able to view lab/test results, encounter notes, upcoming appointments, etc.  Non-urgent messages can be sent to your provider as well.   To learn more about what you can do with MyChart, go to [email or web address].    Your next appointment:   12 month(s)  The format for your next appointment:   In Person  Provider:   Quay Burow, MD

## 2023-01-16 ENCOUNTER — Encounter: Payer: Self-pay | Admitting: Cardiovascular Disease

## 2023-02-18 LAB — HEPATIC FUNCTION PANEL
ALT: 19 IU/L (ref 0–32)
AST: 24 IU/L (ref 0–40)
Albumin: 4.5 g/dL (ref 3.9–4.9)
Alkaline Phosphatase: 45 IU/L (ref 44–121)
Bilirubin Total: 0.6 mg/dL (ref 0.0–1.2)
Bilirubin, Direct: 0.18 mg/dL (ref 0.00–0.40)
Total Protein: 6.4 g/dL (ref 6.0–8.5)

## 2023-02-18 LAB — LIPID PANEL
Chol/HDL Ratio: 1.9 ratio (ref 0.0–4.4)
Cholesterol, Total: 171 mg/dL (ref 100–199)
HDL: 89 mg/dL (ref >39)
LDL Chol Calc (NIH): 73 mg/dL (ref 0–99)
Triglycerides: 42 mg/dL (ref 0–149)
VLDL Cholesterol Cal: 9 mg/dL (ref 5–40)

## 2023-02-19 ENCOUNTER — Encounter: Payer: Self-pay | Admitting: Cardiovascular Disease

## 2023-02-19 MED ORDER — ROSUVASTATIN CALCIUM 5 MG PO TABS: 5.0000 mg | ORAL_TABLET | Freq: Every day | ORAL | 3 refills | Status: DC

## 2023-11-11 LAB — LAB REPORT - SCANNED: EGFR: 71.3

## 2023-11-24 ENCOUNTER — Ambulatory Visit: Payer: Medicare Other | Attending: Cardiovascular Disease | Admitting: Cardiovascular Disease

## 2023-11-24 ENCOUNTER — Encounter: Payer: Self-pay | Admitting: Cardiovascular Disease

## 2023-11-24 VITALS — BP 120/66 | HR 73 | Ht 65.0 in | Wt 137.0 lb

## 2023-11-24 DIAGNOSIS — E782 Mixed hyperlipidemia: Secondary | ICD-10-CM

## 2023-11-24 DIAGNOSIS — R931 Abnormal findings on diagnostic imaging of heart and coronary circulation: Secondary | ICD-10-CM

## 2023-11-24 MED ORDER — ROSUVASTATIN CALCIUM 5 MG PO TABS: ORAL_TABLET | ORAL | 3 refills | Status: DC

## 2023-11-24 NOTE — Assessment & Plan Note (Signed)
History of hyperlipidemia on rosuvastatin 5-day with lipid profile performed 11/11/2023 revealing a total cholesterol 160, LDL of 82 and HDL of 70, not at goal for secondary prevention given her elevated coronary calcium score.  I am going to increase her rosuvastatin to 5 mg alternating with 10 mg a day and we will recheck a FLP in 3 months.  LDL goal less than 70.

## 2023-11-24 NOTE — Assessment & Plan Note (Signed)
Elevated coronary calcium score of 343 performed 12/12/21 primarily in the LAD distribution.  She is very active, works out with a Psychologist, educational and is completely asymptomatic.  She does have hyperlipidemia on rosuvastatin, not at goal for secondary prevention which will be addressed.

## 2023-11-24 NOTE — Progress Notes (Signed)
11/24/2023 Vanessa Diaz   1956/01/09  [id number]  Primary Physician Alysia Penna, MD Primary Cardiologist: Runell Gess MD Milagros Loll, [place], [place]  HPI:  Vanessa Diaz is a 67 y.o.  thin and fit appearing married Caucasian female mother of 5, grandmother of 5 grandchildren referred by her PCP, Dr. Link Snuffer, for elevated coronary calcium score.  I last saw her in the office 04/16/2022.  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  Her only cardiac risk factor is hyperlipidemia. There is no family history for heart disease. She is never had heart attack or stroke. She denies chest pain or shortness of breath. She is very active and works out with a Psychologist, educational. She did have a coronary calcium score performed 12/12/1961 which was 343 total the majority of which was in the LAD. Based on this, her primary care physician began her on atorvastatin which resulted in myalgias and arthralgias despite a reduced dose and she was changed to rosuvastatin which she is currently tolerating.   Since I saw her 12 months ago she continues to do well.  She is still active working with a Psychologist, educational.  She denies chest pain or shortness of breath.  Recent lipid profile with rosuvastatin 5 mg a day performed 11/11/2023 revealed total cholesterol 160, LDL 82 and HDL 70.  No outpatient medications have been marked as taking for the 11/24/23 encounter (Office Visit) with Runell Gess, MD.     No Known Allergies  Social History   Socioeconomic History   Marital status: Married    Spouse name: Not on file   Number of children: Not on file   Years of education: Not on file   Highest education level: Not on file  Occupational History   Not on file  Tobacco Use   Smoking status: Never   Smokeless tobacco: Not on file  Substance and Sexual Activity   Alcohol use: Yes   Drug use: No   Sexual activity: Not on file  Other Topics  Concern   Not on file  Social History Narrative   Not on file   Social Drivers of Health   Financial Resource Strain: Not on file  Food Insecurity: Not on file  Transportation Needs: Not on file  Physical Activity: Not on file  Stress: Not on file  Social Connections: Not on file  Intimate Partner Violence: Not on file     Review of Systems: General: negative for chills, fever, night sweats or weight changes.  Cardiovascular: negative for chest pain, dyspnea on exertion, edema, orthopnea, palpitations, paroxysmal nocturnal dyspnea or shortness of breath Dermatological: negative for rash Respiratory: negative for cough or wheezing Urologic: negative for hematuria Abdominal: negative for nausea, vomiting, diarrhea, bright red blood per rectum, melena, or hematemesis Neurologic: negative for visual changes, syncope, or dizziness All other systems reviewed and are otherwise negative except as noted above.    Blood pressure 120/66, pulse 73, height 5\' 5"  (1.651 m), weight 137 lb (62.1 kg).  General appearance: alert and no distress Neck: no adenopathy, no carotid bruit, no JVD, supple, symmetrical, trachea midline, and thyroid not enlarged, symmetric, no tenderness/mass/nodules Lungs: clear to auscultation bilaterally Heart: regular rate and rhythm, S1, S2 normal, no murmur, click, rub or gallop Extremities: extremities normal, atraumatic, no cyanosis or edema Pulses: 2+ and symmetric Skin: Skin color, texture, turgor normal. No rashes or  lesions Neurologic: Grossly normal  EKG EKG Interpretation Date/Time:  Monday November 24 2023 11:27:47 EST Ventricular Rate:  73 PR Interval:  140 QRS Duration:  86 QT Interval:  400 QTC Calculation: 440 R Axis:   73  Text Interpretation: Normal sinus rhythm Normal ECG When compared with ECG of 23-Nov-1998 14:59, Incomplete right bundle branch block is no longer Present Nonspecific T wave abnormality no longer evident in Lateral leads  Confirmed by Nanetta Batty [phone]) on 11/24/2023 11:38:34 AM    ASSESSMENT AND PLAN:   Hyperlipidemia History of hyperlipidemia on rosuvastatin 5-day with lipid profile performed 11/11/2023 revealing a total cholesterol 160, LDL of 82 and HDL of 70, not at goal for secondary prevention given her elevated coronary calcium score.  I am going to increase her rosuvastatin to 5 mg alternating with 10 mg a day and we will recheck a FLP in 3 months.  LDL goal less than 70.  Elevated coronary artery calcium score Elevated coronary calcium score of 343 performed 12/12/21 primarily in the LAD distribution.  She is very active, works out with a Psychologist, educational and is completely asymptomatic.  She does have hyperlipidemia on rosuvastatin, not at goal for secondary prevention which will be addressed.     Runell Gess MD FACP,FACC,FAHA, [facility] 11/24/2023 11:46 AM

## 2023-11-24 NOTE — Patient Instructions (Signed)
Medication Instructions:  Your physician has recommended you make the following change in your medication:   -Take rosuvastatin (crestor) 5mg  alternating with 10mg  every other day.   *If you need a refill on your cardiac medications before your next appointment, please call your pharmacy*   Lab Work: Your physician recommends that you return for lab work in: 3 months for FASTING lipid/liver panel  If you have labs (blood work) drawn today and your tests are completely normal, you will receive your results only by: MyChart Message (if you have MyChart) OR A paper copy in the mail If you have any lab test that is abnormal or we need to change your treatment, we will call you to review the results.   Follow-Up: At [facility], you and your health needs are our priority.  As part of our continuing mission to provide you with exceptional heart care, we have created designated Provider Care Teams.  These Care Teams include your primary Cardiologist (physician) and Advanced Practice Providers (APPs -  Physician Assistants and Nurse Practitioners) who all work together to provide you with the care you need, when you need it.  We recommend signing up for the patient portal called "MyChart".  Sign up information is provided on this After Visit Summary.  MyChart is used to connect with patients for Virtual Visits (Telemedicine).  Patients are able to view lab/test results, encounter notes, upcoming appointments, etc.  Non-urgent messages can be sent to your provider as well.   To learn more about what you can do with MyChart, go to [email or web address].    Your next appointment:   12 month(s)  Provider:   Nanetta Batty, MD

## 2023-11-25 ENCOUNTER — Telehealth: Payer: Self-pay | Admitting: Cardiovascular Disease

## 2023-11-25 MED ORDER — ROSUVASTATIN CALCIUM 10 MG PO TABS: ORAL_TABLET | ORAL | 3 refills | Status: AC

## 2023-11-25 NOTE — Telephone Encounter (Signed)
Pt c/o medication issue:  1. Name of Medication:   rosuvastatin (CRESTOR) 5 MG tablet   2. How are you currently taking this medication (dosage and times per day)?   Not started yet  3. Are you having a reaction (difficulty breathing--STAT)?   4. What is your medication issue?    Patient stated she will need a new 90-day prescription for the 10 mg Rosuvastatin sent to [id number] - [place],  - [address] AT [facility] [address] [facility] [address]  as her insurance will only cover the 10 mg prescription at this time.

## 2023-11-25 NOTE — Telephone Encounter (Signed)
Changed rosuvastatin from 5 mg to 10 mg with dosage instructions due to pt insurance not covering 5 mg tablets.   Josie, LPN

## 2023-11-26 NOTE — Telephone Encounter (Signed)
Left detailed message (ok per DPR) letting pt know that new prescription for rosuvastatin (crestor) 10mg  tablets. Phone number left for pt to call if needed.

## 2024-02-03 ENCOUNTER — Encounter: Payer: Self-pay | Admitting: Cardiovascular Disease

## 2024-02-05 NOTE — Telephone Encounter (Signed)
 Last read by Monica Martinez at  6:43 AM on 02/05/2024.

## 2024-02-16 ENCOUNTER — Other Ambulatory Visit (HOSPITAL_COMMUNITY): Payer: Self-pay

## 2024-02-16 ENCOUNTER — Telehealth: Payer: Self-pay | Admitting: Pharmacy Technician

## 2024-02-16 NOTE — Telephone Encounter (Signed)
 Pharmacy Patient Advocate Encounter  Received notification from [facility] that Prior Authorization for rosuvastatin has been APPROVED from 02/16/24 to 12/08/24     I called her pharmacy and they said she just got it last month for 90 days

## 2024-02-16 NOTE — Telephone Encounter (Signed)
 Called number provided by pt for [employer]. They are wanting a prior auth for rosuvastatin. Started PA with representative on phone. Reference number# [id number]. Will forward to Rx PA team to be on look out for response.

## 2024-02-26 LAB — LIPID PANEL
Chol/HDL Ratio: 1.6 ratio (ref 0.0–4.4)
Cholesterol, Total: 120 mg/dL (ref 100–199)
HDL: 73 mg/dL (ref >39)
LDL Chol Calc (NIH): 38 mg/dL (ref 0–99)
Triglycerides: 28 mg/dL (ref 0–149)
VLDL Cholesterol Cal: 9 mg/dL (ref 5–40)

## 2024-02-26 LAB — HEPATIC FUNCTION PANEL
ALT: 19 IU/L (ref 0–32)
AST: 23 IU/L (ref 0–40)
Albumin: 4.1 g/dL (ref 3.9–4.9)
Alkaline Phosphatase: 45 IU/L (ref 44–121)
Bilirubin Total: 0.4 mg/dL (ref 0.0–1.2)
Bilirubin, Direct: 0.18 mg/dL (ref 0.00–0.40)
Total Protein: 6 g/dL (ref 6.0–8.5)

## 2024-06-21 ENCOUNTER — Telehealth: Payer: Self-pay | Admitting: Cardiovascular Disease

## 2024-06-21 ENCOUNTER — Encounter: Payer: Self-pay | Admitting: Cardiovascular Disease

## 2024-06-21 NOTE — Telephone Encounter (Signed)
 Pt c/o Shortness Of Breath: STAT if SOB developed within the last 24 hours or pt is noticeably SOB on the phone  1. Are you currently SOB (can you hear that pt is SOB on the phone)? No    2. How long have you been experiencing SOB? Since this weekend   3. Are you SOB when sitting or when up moving around? Moving around   4. Are you currently experiencing any other symptoms? No

## 2024-06-21 NOTE — Telephone Encounter (Signed)
 Pt called and sent a mychart message regarding this concern. Phone encounter routed to Dr. Court to advise. Will close this encounter.

## 2024-06-21 NOTE — Telephone Encounter (Signed)
 Pt has been scheduled for further evaluation with Dr Court on 06/22/24 at 9:15 am.  She was appreciative of the call and appt.

## 2024-06-21 NOTE — Telephone Encounter (Signed)
 Spoke with pt over the phone. She stated that she has been feeling like she can't catch her breath while doing activities starting this past weekend. Pt denies any CP, chest discomfort, palpitations. Pt states she has not checked her BP and doesn't have a way to check it while at home. Pt states she has had some lightheadedness issues during these times but it resolves relatively quickly. Asked pt to look at her Apple watch to see what her HR is - HR 77 currently. Low blood pressure hx per pt but has never had any issues with it being too low. Explained to pt that I will send this information to Dr. Court and his nurse to review and we will be in touch with any recommendations. Pt verbalized understanding of plan and had no further questions at this time.

## 2024-06-22 ENCOUNTER — Ambulatory Visit: Attending: Cardiovascular Disease | Admitting: Cardiovascular Disease

## 2024-06-22 ENCOUNTER — Encounter: Payer: Self-pay | Admitting: Cardiovascular Disease

## 2024-06-22 ENCOUNTER — Other Ambulatory Visit: Payer: Self-pay | Admitting: Cardiovascular Disease

## 2024-06-22 VITALS — BP 120/72 | HR 66 | Ht 67.5 in | Wt 136.0 lb

## 2024-06-22 DIAGNOSIS — E782 Mixed hyperlipidemia: Secondary | ICD-10-CM

## 2024-06-22 DIAGNOSIS — R931 Abnormal findings on diagnostic imaging of heart and coronary circulation: Secondary | ICD-10-CM

## 2024-06-22 DIAGNOSIS — I2089 Other forms of angina pectoris: Secondary | ICD-10-CM | POA: Diagnosis not present

## 2024-06-22 NOTE — Assessment & Plan Note (Signed)
 Vanessa Diaz returns today with new onset exertional chest pressure and dyspnea.  She did have a coronary calcium  score performed 12/12/2021 which was 343 mostly in the LAD territory.  Based on this and the new onset of her symptoms, I prefer to proceed directly with outpatient coronary angiography.  I have reviewed the risks, indications, and alternatives to cardiac catheterization, possible angioplasty, and stenting with the patient. Risks include but are not limited to bleeding, infection, vascular injury, stroke, myocardial infection, arrhythmia, kidney injury, radiation-related injury in the case of prolonged fluoroscopy use, emergency cardiac surgery, and death. The patient understands the risks of serious complication is 1-2 in 1000 with diagnostic cardiac cath and 1-2% or less with angioplasty/stenting.

## 2024-06-22 NOTE — H&P (View-Only) (Signed)
 06/22/2024 Vanessa Diaz   12-Nov-1956  [id number]  Primary Physician Vanessa Hamilton, MD Primary Cardiologist: Vanessa [name] [address], [place], [place]  HPI:  Vanessa Diaz is a 68 y.o.   thin and fit appearing married Caucasian female mother of 5, grandmother of 5 grandchildren referred by her PCP, Dr. Larnell, for elevated coronary calcium  score.  I last saw her in the office 12/24/2022.  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  Her only cardiac risk factor is hyperlipidemia. There is no family history for heart disease. She is never had heart attack or stroke. She denies chest pain or shortness of breath. She is very active and works out with a Psychologist, educational. She did have a coronary calcium  score performed 12/12/1961 which was 343 total the majority of which was in the LAD. Based on this, her primary care physician began her on atorvastatin which resulted in myalgias and arthralgias despite a reduced dose and she was changed to rosuvastatin  which she is currently tolerating.   Since I saw her in the office 8 months ago she has developed new onset chest heaviness and dyspnea over the last week.  She still very active but unable to do that her normal activities without symptoms.  Based on her elevated coronary calcium  score and the new onset symptoms we discussed proceeding with outpatient diagnostic coronary angiography.   Current Meds  Medication Sig   aspirin EC 81 MG tablet Take 81 mg by mouth daily.   BISACODYL 5 MG EC tablet Take 5 mg by mouth as directed.   chlorthalidone (HYGROTEN) 15 MG tablet Take 7.5 mg by mouth daily.   denosumab (PROLIA) 60 MG/ML SOSY injection Inject 60 mg into the skin every 6 (six) months.   estradiol (ESTRACE) 0.1 MG/GM vaginal cream Place 1 Applicatorful vaginally 2 (two) times a week.   fluorometholone (FML) 0.1 % ophthalmic suspension Place 1 drop into both eyes 2 (two) times  daily.   fluticasone (FLONASE) 50 MCG/ACT nasal spray Place 2 sprays into both nostrils daily.   Glucosamine HCl 1500 MG TABS Take 1 tablet by mouth in the morning and at bedtime.   glucosamine-chondroitin 500-400 MG tablet Take 1 tablet by mouth daily.    ibuprofen (ADVIL,MOTRIN) 200 MG tablet Take 800 mg by mouth every 8 (eight) hours as needed. For pain.    magnesium oxide (MAG-OX) 400 MG tablet Take 400 mg by mouth 2 (two) times daily.   meloxicam (MOBIC) 7.5 MG tablet Take 7.5 mg by mouth daily.   montelukast (SINGULAIR) 10 MG tablet Take 10 mg by mouth daily.   Montelukast Sodium (SINGULAIR PO) Take 1 tablet by mouth daily.   omega-3 acid ethyl esters (LOVAZA) 1 g capsule Take 1 g by mouth daily.   polyethylene glycol-electrolytes (NULYTELY) 420 g solution Take 4,000 mLs by mouth as directed.   potassium citrate (UROCIT-K) 10 MEQ (1080 MG) SR tablet Take 10 mEq by mouth 2 (two) times daily.   pyridOXINE (VITAMIN B-6) 100 MG tablet Take 100 mg by mouth daily.   rosuvastatin  (CRESTOR ) 10 MG tablet Take 1/2 tablet (5 mg) on even days and take 1 tablet (10 mg) on odd days   tamsulosin (FLOMAX) 0.4 MG CAPS capsule Take 0.4 mg by mouth as needed.   vitamin E 100 UNIT capsule Take 100 Units by mouth daily.  XIIDRA 5 % SOLN Place 1 drop into both eyes 2 (two) times daily.     Allergies  Allergen Reactions   Atorvastatin     Other Reaction(s): muscle cramps    Social History   Socioeconomic History   Marital status: Married    Spouse name: Not on file   Number of children: Not on file   Years of education: Not on file   Highest education level: Not on file  Occupational History   Not on file  Tobacco Use   Smoking status: Never   Smokeless tobacco: Not on file  Substance and Sexual Activity   Alcohol  use: Yes   Drug use: No   Sexual activity: Not on file  Other Topics Concern   Not on file  Social History Narrative   Not on file   Social Drivers of Health   Financial  Resource Strain: Not on file  Food Insecurity: Not on file  Transportation Needs: Not on file  Physical Activity: Not on file  Stress: Not on file  Social Connections: Not on file  Intimate Partner Violence: Not on file     Review of Systems: General: negative for chills, fever, night sweats or weight changes.  Cardiovascular: negative for chest pain, dyspnea on exertion, edema, orthopnea, palpitations, paroxysmal nocturnal dyspnea or shortness of breath Dermatological: negative for rash Respiratory: negative for cough or wheezing Urologic: negative for hematuria Abdominal: negative for nausea, vomiting, diarrhea, bright red blood per rectum, melena, or hematemesis Neurologic: negative for visual changes, syncope, or dizziness All other systems reviewed and are otherwise negative except as noted above.    Blood pressure 120/72, pulse 66, height 5' 7.5 (1.715 m), weight 136 lb (61.7 kg), SpO2 98%.  General appearance: alert and no distress Neck: no adenopathy, no carotid bruit, no JVD, supple, symmetrical, trachea midline, and thyroid not enlarged, symmetric, no tenderness/mass/nodules Lungs: clear to auscultation bilaterally Heart: regular rate and rhythm, S1, S2 normal, no murmur, click, rub or gallop Extremities: extremities normal, atraumatic, no cyanosis or edema Pulses: 2+ and symmetric Skin: Skin color, texture, turgor normal. No rashes or lesions Neurologic: Grossly normal  EKG EKG Interpretation Date/Time:  Tuesday June 22 2024 09:15:09 EDT Ventricular Rate:  66 PR Interval:  148 QRS Duration:  88 QT Interval:  422 QTC Calculation: 442 R Axis:   61  Text Interpretation: Normal sinus rhythm Septal infarct , age undetermined When compared with ECG of 24-Nov-2023 11:27, No significant change was found Confirmed by Court Carrier [phone]) on 06/22/2024 9:24:25 AM    ASSESSMENT AND PLAN:   Elevated coronary artery calcium  score [name] returns today with new onset  exertional chest pressure and dyspnea.  She did have a coronary calcium  score performed 12/12/2021 which was 343 mostly in the LAD territory.  Based on this and the new onset of her symptoms, I prefer to proceed directly with outpatient coronary angiography.  I have reviewed the risks, indications, and alternatives to cardiac catheterization, possible angioplasty, and stenting with the patient. Risks include but are not limited to bleeding, infection, vascular injury, stroke, myocardial infection, arrhythmia, kidney injury, radiation-related injury in the case of prolonged fluoroscopy use, emergency cardiac surgery, and death. The patient understands the risks of serious complication is 1-2 in 1000 with diagnostic cardiac cath and 1-2% or less with angioplasty/stenting.       Carrier DOROTHA Court MD FACP,FACC,FAHA, [facility] 06/22/2024 9:39 AM

## 2024-06-22 NOTE — Patient Instructions (Addendum)
 Medication Instructions:  Your physician recommends that you continue on your current medications as directed. Please refer to the Current Medication list given to you today.  *If you need a refill on your cardiac medications before your next appointment, please call your pharmacy*  Lab Work: Your physician recommends that you have labs drawn today: BMET & CBC  If you have labs (blood work) drawn today and your tests are completely normal, you will receive your results only by: MyChart Message (if you have MyChart) OR A paper copy in the mail If you have any lab test that is abnormal or we need to change your treatment, we will call you to review the results.  Testing/Procedures: See below  Follow-Up: At [facility], you and your health needs are our priority.  As part of our continuing mission to provide you with exceptional heart care, our providers are all part of one team.  This team includes your primary Cardiologist (physician) and Advanced Practice Providers or APPs (Physician Assistants and Nurse Practitioners) who all work together to provide you with the care you need, when you need it.  Your next appointment:   2-3 week(s) after your procedure (7/17)  Provider:   Dorn Lesches, MD   We recommend signing up for the patient portal called MyChart.  Sign up information is provided on this After Visit Summary.  MyChart is used to connect with patients for Virtual Visits (Telemedicine).  Patients are able to view lab/test results, encounter notes, upcoming appointments, etc.  Non-urgent messages can be sent to your provider as well.   To learn more about what you can do with MyChart, go to [email or web address].   Other Instructions       Cardiac/Peripheral Catheterization   You are scheduled for a Cardiac Catheterization on Thursday, July 17 with Dr. Darron.  1. Please arrive at the [facility] (Main Entrance A) at [facility]: [address]  [place], [place] [address] at 7:00 AM (This time is 2 hour(s) before your procedure to ensure your preparation).   Free valet parking service is available. You will check in at ADMITTING. The support person will be asked to wait in the waiting room.  It is OK to have someone drop you off and come back when you are ready to be discharged.        Special note: Every effort is made to have your procedure done on time. Please understand that emergencies sometimes delay scheduled procedures.  2. Diet: Do not eat solid foods after midnight.  You may have clear liquids until 5 AM the day of the procedure.  3. Labs: You will need to have blood drawn today (7/15)    4. Medication instructions in preparation for your procedure:    On the morning of your procedure, take Aspirin 81 mg and any morning medicines NOT listed above.  You may use sips of water.  5. Plan to go home the same day, you will only stay overnight if medically necessary. 6. You MUST have a responsible adult to drive you home. 7. An adult MUST be with you the first 24 hours after you arrive home. 8. Bring a current list of your medications, and the last time and date medication taken. 9. Bring ID and current insurance cards. 10.Please wear clothes that are easy to get on and off and wear slip-on shoes.  Thank you for allowing us  to care for you!   -- [facility]

## 2024-06-22 NOTE — Progress Notes (Signed)
 06/22/2024 Vanessa Diaz   12-Nov-1956  [id number]  Primary Physician Larnell Hamilton, MD Primary Cardiologist: Dorn [name] [facility], [place], [place]  HPI:  LACHRISHA Diaz is a 68 y.o.   thin and fit appearing married Caucasian female mother of 5, grandmother of 5 grandchildren referred by her PCP, Dr. Larnell, for elevated coronary calcium  score.  I last saw her in the office 12/24/2022.  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  Her only cardiac risk factor is hyperlipidemia. There is no family history for heart disease. She is never had heart attack or stroke. She denies chest pain or shortness of breath. She is very active and works out with a Psychologist, educational. She did have a coronary calcium  score performed 12/12/1961 which was 343 total the majority of which was in the LAD. Based on this, her primary care physician began her on atorvastatin which resulted in myalgias and arthralgias despite a reduced dose and she was changed to rosuvastatin  which she is currently tolerating.   Since I saw her in the office 8 months ago she has developed new onset chest heaviness and dyspnea over the last week.  She still very active but unable to do that her normal activities without symptoms.  Based on her elevated coronary calcium  score and the new onset symptoms we discussed proceeding with outpatient diagnostic coronary angiography.   Current Meds  Medication Sig   aspirin EC 81 MG tablet Take 81 mg by mouth daily.   BISACODYL 5 MG EC tablet Take 5 mg by mouth as directed.   chlorthalidone (HYGROTEN) 15 MG tablet Take 7.5 mg by mouth daily.   denosumab (PROLIA) 60 MG/ML SOSY injection Inject 60 mg into the skin every 6 (six) months.   estradiol (ESTRACE) 0.1 MG/GM vaginal cream Place 1 Applicatorful vaginally 2 (two) times a week.   fluorometholone (FML) 0.1 % ophthalmic suspension Place 1 drop into both eyes 2 (two) times  daily.   fluticasone (FLONASE) 50 MCG/ACT nasal spray Place 2 sprays into both nostrils daily.   Glucosamine HCl 1500 MG TABS Take 1 tablet by mouth in the morning and at bedtime.   glucosamine-chondroitin 500-400 MG tablet Take 1 tablet by mouth daily.    ibuprofen (ADVIL,MOTRIN) 200 MG tablet Take 800 mg by mouth every 8 (eight) hours as needed. For pain.    magnesium oxide (MAG-OX) 400 MG tablet Take 400 mg by mouth 2 (two) times daily.   meloxicam (MOBIC) 7.5 MG tablet Take 7.5 mg by mouth daily.   montelukast (SINGULAIR) 10 MG tablet Take 10 mg by mouth daily.   Montelukast Sodium (SINGULAIR PO) Take 1 tablet by mouth daily.   omega-3 acid ethyl esters (LOVAZA) 1 g capsule Take 1 g by mouth daily.   polyethylene glycol-electrolytes (NULYTELY) 420 g solution Take 4,000 mLs by mouth as directed.   potassium citrate (UROCIT-K) 10 MEQ (1080 MG) SR tablet Take 10 mEq by mouth 2 (two) times daily.   pyridOXINE (VITAMIN B-6) 100 MG tablet Take 100 mg by mouth daily.   rosuvastatin  (CRESTOR ) 10 MG tablet Take 1/2 tablet (5 mg) on even days and take 1 tablet (10 mg) on odd days   tamsulosin (FLOMAX) 0.4 MG CAPS capsule Take 0.4 mg by mouth as needed.   vitamin E 100 UNIT capsule Take 100 Units by mouth daily.  XIIDRA 5 % SOLN Place 1 drop into both eyes 2 (two) times daily.     Allergies  Allergen Reactions   Atorvastatin     Other Reaction(s): muscle cramps    Social History   Socioeconomic History   Marital status: Married    Spouse name: Not on file   Number of children: Not on file   Years of education: Not on file   Highest education level: Not on file  Occupational History   Not on file  Tobacco Use   Smoking status: Never   Smokeless tobacco: Not on file  Substance and Sexual Activity   Alcohol  use: Yes   Drug use: No   Sexual activity: Not on file  Other Topics Concern   Not on file  Social History Narrative   Not on file   Social Drivers of Health   Financial  Resource Strain: Not on file  Food Insecurity: Not on file  Transportation Needs: Not on file  Physical Activity: Not on file  Stress: Not on file  Social Connections: Not on file  Intimate Partner Violence: Not on file     Review of Systems: General: negative for chills, fever, night sweats or weight changes.  Cardiovascular: negative for chest pain, dyspnea on exertion, edema, orthopnea, palpitations, paroxysmal nocturnal dyspnea or shortness of breath Dermatological: negative for rash Respiratory: negative for cough or wheezing Urologic: negative for hematuria Abdominal: negative for nausea, vomiting, diarrhea, bright red blood per rectum, melena, or hematemesis Neurologic: negative for visual changes, syncope, or dizziness All other systems reviewed and are otherwise negative except as noted above.    Blood pressure 120/72, pulse 66, height 5' 7.5 (1.715 m), weight 136 lb (61.7 kg), SpO2 98%.  General appearance: alert and no distress Neck: no adenopathy, no carotid bruit, no JVD, supple, symmetrical, trachea midline, and thyroid not enlarged, symmetric, no tenderness/mass/nodules Lungs: clear to auscultation bilaterally Heart: regular rate and rhythm, S1, S2 normal, no murmur, click, rub or gallop Extremities: extremities normal, atraumatic, no cyanosis or edema Pulses: 2+ and symmetric Skin: Skin color, texture, turgor normal. No rashes or lesions Neurologic: Grossly normal  EKG EKG Interpretation Date/Time:  Tuesday June 22 2024 09:15:09 EDT Ventricular Rate:  66 PR Interval:  148 QRS Duration:  88 QT Interval:  422 QTC Calculation: 442 R Axis:   61  Text Interpretation: Normal sinus rhythm Septal infarct , age undetermined When compared with ECG of 24-Nov-2023 11:27, No significant change was found Confirmed by Court Carrier [phone]) on 06/22/2024 9:24:25 AM    ASSESSMENT AND PLAN:   Elevated coronary artery calcium  score [name] returns today with new onset  exertional chest pressure and dyspnea.  She did have a coronary calcium  score performed 12/12/2021 which was 343 mostly in the LAD territory.  Based on this and the new onset of her symptoms, I prefer to proceed directly with outpatient coronary angiography.  I have reviewed the risks, indications, and alternatives to cardiac catheterization, possible angioplasty, and stenting with the patient. Risks include but are not limited to bleeding, infection, vascular injury, stroke, myocardial infection, arrhythmia, kidney injury, radiation-related injury in the case of prolonged fluoroscopy use, emergency cardiac surgery, and death. The patient understands the risks of serious complication is 1-2 in 1000 with diagnostic cardiac cath and 1-2% or less with angioplasty/stenting.       Carrier DOROTHA Court MD FACP,FACC,FAHA, [facility] 06/22/2024 9:39 AM

## 2024-06-23 ENCOUNTER — Ambulatory Visit: Payer: Self-pay | Admitting: Cardiovascular Disease

## 2024-06-23 ENCOUNTER — Telehealth: Payer: Self-pay | Admitting: *Deleted

## 2024-06-23 LAB — CBC WITH DIFFERENTIAL/PLATELET
Basophils Absolute: 0.1 x10E3/uL (ref 0.0–0.2)
Basos: 1 %
EOS (ABSOLUTE): 0.3 x10E3/uL (ref 0.0–0.4)
Eos: 5 %
Hematocrit: 45.9 % (ref 34.0–46.6)
Hemoglobin: 15 g/dL (ref 11.1–15.9)
Immature Grans (Abs): 0 x10E3/uL (ref 0.0–0.1)
Immature Granulocytes: 0 %
Lymphocytes Absolute: 1.4 x10E3/uL (ref 0.7–3.1)
Lymphs: 22 %
MCH: 31.4 pg (ref 26.6–33.0)
MCHC: 32.7 g/dL (ref 31.5–35.7)
MCV: 96 fL (ref 79–97)
Monocytes Absolute: 0.7 x10E3/uL (ref 0.1–0.9)
Monocytes: 11 %
Neutrophils Absolute: 3.9 x10E3/uL (ref 1.4–7.0)
Neutrophils: 61 %
Platelets: 249 x10E3/uL (ref 150–450)
RBC: 4.77 x10E6/uL (ref 3.77–5.28)
RDW: 12.7 % (ref 11.7–15.4)
WBC: 6.3 x10E3/uL (ref 3.4–10.8)

## 2024-06-23 LAB — BASIC METABOLIC PANEL WITH GFR
BUN/Creatinine Ratio: 31 — ABNORMAL HIGH (ref 12–28)
BUN: 27 mg/dL (ref 8–27)
CO2: 23 mmol/L (ref 20–29)
Calcium: 9.8 mg/dL (ref 8.7–10.3)
Chloride: 97 mmol/L (ref 96–106)
Creatinine, Ser: 0.86 mg/dL (ref 0.57–1.00)
Glucose: 84 mg/dL (ref 70–99)
Potassium: 4.4 mmol/L (ref 3.5–5.2)
Sodium: 137 mmol/L (ref 134–144)
eGFR: 73 mL/min/1.73 (ref >59)

## 2024-06-23 NOTE — Telephone Encounter (Signed)
 Cardiac Catheterization scheduled at [facility] for: Thursday June 24, 2024 9 AM Arrival time [facility] Main Entrance A at: 7 AM  Nothing to eat after midnight prior to procedure, clear liquids until 5 AM day of procedure.  Medication instructions: -Hold:  Chlorthalidone/KCl-AM of procedure -Other usual morning medications can be taken with sips of water including aspirin 81 mg.  Plan to go home the same day, you will only stay overnight if medically necessary.  You must have responsible adult to drive you home.  Someone must be with you the first 24 hours after you arrive home.   Reviewed procedure instructions with patient.

## 2024-06-23 NOTE — Telephone Encounter (Signed)
Encounter entry error

## 2024-06-24 ENCOUNTER — Other Ambulatory Visit: Payer: Self-pay

## 2024-06-24 ENCOUNTER — Ambulatory Visit (HOSPITAL_COMMUNITY)
Admission: RE | Admit: 2024-06-24 | Discharge: 2024-06-24 | Disposition: A | Discharge status: Home / Self Care | Attending: Cardiovascular Disease | Admitting: Cardiovascular Disease

## 2024-06-24 ENCOUNTER — Encounter (HOSPITAL_COMMUNITY): Payer: Self-pay | Admitting: Cardiovascular Disease

## 2024-06-24 ENCOUNTER — Encounter (HOSPITAL_COMMUNITY)
Admission: RE | Disposition: A | Discharge status: Home / Self Care | Payer: Self-pay | Source: Home / Self Care | Attending: Cardiovascular Disease

## 2024-06-24 DIAGNOSIS — I2584 Coronary atherosclerosis due to calcified coronary lesion: Secondary | ICD-10-CM | POA: Insufficient documentation

## 2024-06-24 DIAGNOSIS — I25118 Atherosclerotic heart disease of native coronary artery with other forms of angina pectoris: Secondary | ICD-10-CM | POA: Insufficient documentation

## 2024-06-24 DIAGNOSIS — I2510 Atherosclerotic heart disease of native coronary artery without angina pectoris: Secondary | ICD-10-CM | POA: Diagnosis not present

## 2024-06-24 DIAGNOSIS — I2089 Other forms of angina pectoris: Secondary | ICD-10-CM

## 2024-06-24 DIAGNOSIS — E785 Hyperlipidemia, unspecified: Secondary | ICD-10-CM | POA: Insufficient documentation

## 2024-06-24 DIAGNOSIS — Z79899 Other long term (current) drug therapy: Secondary | ICD-10-CM | POA: Insufficient documentation

## 2024-06-24 HISTORY — PX: LEFT HEART CATH AND CORONARY ANGIOGRAPHY: CATH118249

## 2024-06-24 SURGERY — LEFT HEART CATH AND CORONARY ANGIOGRAPHY
Anesthesia: LOCAL

## 2024-06-24 MED ORDER — LIDOCAINE HCL (PF) 1 % IJ SOLN
INTRAMUSCULAR | Status: DC | PRN
Start: 2024-06-24 — End: 2024-06-24
  Administered 2024-06-24: 2 mL

## 2024-06-24 MED ORDER — HEPARIN SODIUM (PORCINE) 1000 UNIT/ML IJ SOLN
INTRAMUSCULAR | Status: AC
  Filled 2024-06-24: qty 10

## 2024-06-24 MED ORDER — VERAPAMIL HCL 2.5 MG/ML IV SOLN
INTRAVENOUS | Status: DC | PRN
  Administered 2024-06-24: 10 mL via INTRA_ARTERIAL

## 2024-06-24 MED ORDER — FENTANYL CITRATE (PF) 100 MCG/2ML IJ SOLN
INTRAMUSCULAR | Status: AC
Start: 2024-06-24 — End: 2024-06-24
  Filled 2024-06-24: qty 2

## 2024-06-24 MED ORDER — MIDAZOLAM HCL 2 MG/2ML IJ SOLN
INTRAMUSCULAR | Status: DC | PRN
Start: 2024-06-24 — End: 2024-06-24
  Administered 2024-06-24: 1 mg via INTRAVENOUS

## 2024-06-24 MED ORDER — FENTANYL CITRATE (PF) 100 MCG/2ML IJ SOLN
INTRAMUSCULAR | Status: DC | PRN
  Administered 2024-06-24: 25 ug via INTRAVENOUS

## 2024-06-24 MED ORDER — IOHEXOL 350 MG/ML SOLN
INTRAVENOUS | Status: DC | PRN
  Administered 2024-06-24: 50 mL

## 2024-06-24 MED ORDER — SODIUM CHLORIDE 0.9 % WEIGHT BASED INFUSION: 1.0000 mL/kg/h | INTRAVENOUS | Status: DC

## 2024-06-24 MED ORDER — HEPARIN (PORCINE) IN NACL 1000-0.9 UT/500ML-% IV SOLN
INTRAVENOUS | Status: DC | PRN
  Administered 2024-06-24 (×2): 500 mL

## 2024-06-24 MED ORDER — VERAPAMIL HCL 2.5 MG/ML IV SOLN
INTRAVENOUS | Status: AC
  Filled 2024-06-24: qty 2

## 2024-06-24 MED ORDER — MIDAZOLAM HCL 2 MG/2ML IJ SOLN
INTRAMUSCULAR | Status: AC
  Filled 2024-06-24: qty 2

## 2024-06-24 MED ORDER — SODIUM CHLORIDE 0.9 % IV SOLN
INTRAVENOUS | Status: DC | PRN
  Administered 2024-06-24: 10 mL/h via INTRAVENOUS

## 2024-06-24 MED ORDER — LIDOCAINE HCL (PF) 1 % IJ SOLN
INTRAMUSCULAR | Status: AC
  Filled 2024-06-24: qty 30

## 2024-06-24 MED ORDER — SODIUM CHLORIDE 0.9 % WEIGHT BASED INFUSION: 3.0000 mL/kg/h | INTRAVENOUS | Status: AC

## 2024-06-24 MED ORDER — HEPARIN SODIUM (PORCINE) 1000 UNIT/ML IJ SOLN
INTRAMUSCULAR | Status: DC | PRN
  Administered 2024-06-24: 3000 [IU] via INTRAVENOUS

## 2024-06-24 SURGICAL SUPPLY — 7 items
CATH INFINITI 5FR ANG PIGTAIL (CATHETERS) IMPLANT
CATH INFINITI AMBI 5FR JK (CATHETERS) IMPLANT
DEVICE RAD TR BAND REGULAR (VASCULAR PRODUCTS) IMPLANT
GLIDESHEATH SLEND SS 6F .021 (SHEATH) IMPLANT
GUIDEWIRE INQWIRE 1.5J.035X260 (WIRE) IMPLANT
PACK CARDIAC CATHETERIZATION (CUSTOM PROCEDURE TRAY) ×1 IMPLANT
SET ATX-X65L (MISCELLANEOUS) IMPLANT

## 2024-06-24 NOTE — Discharge Instructions (Addendum)
 Radial Site Care The following information offers guidance on how to care for yourself after your procedure. Your health care provider may also give you more specific instructions. If you have problems or questions, contact your health care provider. What can I expect after the procedure? After the procedure, it is common to have bruising and tenderness in the incision area. Follow these instructions at home: Incision site care  Follow instructions from your health care provider about how to take care of your incision site. Make sure you: Wash your hands with soap and water for at least 20 seconds before and after you change your bandage (dressing). If soap and water are not available, use hand sanitizer. Remove your dressing in 24 hours. Leave stitches (sutures), skin glue, or adhesive strips in place. These skin closures may need to stay in place for 2 weeks or longer. If adhesive strip edges start to loosen and curl up, you may trim the loose edges. Do not remove adhesive strips completely unless your health care provider tells you to do that. Do not take baths, swim, or use a hot tub for at least 1 week. You may shower 24 hours after the procedure or as told by your health care provider. Remove the dressing and gently wash the incision area with plain soap and water. Pat the area dry with a clean towel. Do not rub the site. That could cause bleeding. Do not apply powder or lotion to the site. Check your incision site every day for signs of infection. Check for: Redness, swelling, or pain. Fluid or blood. Warmth. Pus or a bad smell. Activity For 24 hours after the procedure, or as directed by your health care provider: Do not flex or bend the affected arm. Do not push or pull heavy objects with the affected arm. Do not operate machinery or power tools. Do not drive. You should not drive yourself home from the hospital or clinic if you go home during that time period. You may drive 24  hours after the procedure unless your health care provider tells you not to. Do not lift anything that is heavier than 10 lb (4.5 kg), or the limit that you are told, until your health care provider says that it is safe. Return to your normal activities as told by your health care provider. Ask your health care provider what activities are safe for you and when you can return to work. If you were given a sedative during the procedure, it can affect you for several hours. Do not drive or operate machinery until your health care provider says that it is safe. General instructions Take over-the-counter and prescription medicines only as told by your health care provider. If you will be going home right after the procedure, plan to have a responsible adult care for you for the time you are told. This is important. Keep all follow-up visits. This is important. Contact a health care provider if: You have a fever or chills. You have any of these signs of infection at your incision site: Redness, swelling, or pain. Fluid or blood. Warmth. Pus or a bad smell. Get help right away if: The incision area swells very fast. The incision area is bleeding, and the bleeding does not stop when you hold steady pressure on the area. Your arm or hand becomes pale, cool, tingly, or numb. These symptoms may represent a serious problem that is an emergency. Do not wait to see if the symptoms will go away. Get medical  help right away. Call your local emergency services (911 in the [place] Do not drive yourself to the hospital. Summary After the procedure, it is common to have bruising and tenderness at the incision site. Follow instructions from your health care provider about how to take care of your radial site incision. Check the incision every day for signs of infection. Do not lift anything that is heavier than 10 lb (4.5 kg), or the limit that you are told, until your health care provider says that it is  safe. Get help right away if the incision area swells very fast, you have bleeding at the incision site that will not stop, or your arm or hand becomes pale, cool, or numb. This information is not intended to replace advice given to you by your health care provider. Make sure you discuss any questions you have with your health care provider. Document Revised: 01/14/2021 Document Reviewed: 01/14/2021 Elsevier Patient Education  2024 [employer].

## 2024-06-24 NOTE — Interval H&P Note (Signed)
 History and Physical Interval Note:  06/24/2024 11:26 AM  Vanessa Diaz  has presented today for surgery, with the diagnosis of unstable angina.  The various methods of treatment have been discussed with the patient and family. After consideration of risks, benefits and other options for treatment, the patient has consented to  Procedure(s): LEFT HEART CATH AND CORONARY ANGIOGRAPHY (N/A) as a surgical intervention.  The patient's history has been reviewed, patient examined, no change in status, stable for surgery.  I have reviewed the patient's chart and labs.  Questions were answered to the patient's satisfaction.     [name]

## 2024-06-29 ENCOUNTER — Telehealth: Payer: Self-pay

## 2024-07-05 ENCOUNTER — Telehealth: Payer: Self-pay

## 2024-07-05 NOTE — Telephone Encounter (Signed)
 Spoke with pt regarding follow up after heart cath. Pt states that her symptoms have improved some since heart cath. Pt does state that she does still get exertional symptoms that frequency is less. Pt recently visited the beach with her grandkids, she said that they did lots of walking to and from the beach without symptoms. Pt states that sometimes her symptoms maybe associated with more hot and humid conditions. Pt did spin yesterday and didn't have to slow or stop because of symptoms. We discussed Dr. Ranee thoughts around additional testing. In the absence of symptoms will hold off on ordering additional testing and medication. Pt will be seen in clinic on 7/30. Will plan to order echo at that time. Pt verbalizes understanding.

## 2024-07-05 NOTE — Telephone Encounter (Signed)
 Erroneous encounter

## 2024-07-07 ENCOUNTER — Ambulatory Visit: Admitting: Cardiovascular Disease

## 2024-07-07 ENCOUNTER — Ambulatory Visit: Attending: Cardiovascular Disease | Admitting: Cardiovascular Disease

## 2024-07-07 ENCOUNTER — Encounter: Payer: Self-pay | Admitting: Cardiovascular Disease

## 2024-07-07 VITALS — BP 107/64 | HR 70 | Ht 68.0 in | Wt 136.0 lb

## 2024-07-07 DIAGNOSIS — I2510 Atherosclerotic heart disease of native coronary artery without angina pectoris: Secondary | ICD-10-CM | POA: Diagnosis not present

## 2024-07-07 DIAGNOSIS — E782 Mixed hyperlipidemia: Secondary | ICD-10-CM | POA: Diagnosis not present

## 2024-07-07 DIAGNOSIS — R931 Abnormal findings on diagnostic imaging of heart and coronary circulation: Secondary | ICD-10-CM

## 2024-07-07 MED ORDER — METOPROLOL TARTRATE 100 MG PO TABS: ORAL_TABLET | ORAL | 0 refills | Status: DC

## 2024-07-07 NOTE — Patient Instructions (Addendum)
 Medication Instructions:  Your physician recommends that you continue on your current medications as directed. Please refer to the Current Medication list given to you today.  *If you need a refill on your cardiac medications before your next appointment, please call your pharmacy*  Testing/Procedures: Your physician has requested that you have an echocardiogram. Echocardiography is a painless test that uses sound waves to create images of your heart. It provides your doctor with information about the size and shape of your heart and how well your heart's chambers and valves are working. This procedure takes approximately one hour. There are no restrictions for this procedure. Please do NOT wear cologne, perfume, aftershave, or lotions (deodorant is allowed). Please arrive 15 minutes prior to your appointment time.  Please note: We ask at that you not bring children with you during ultrasound (echo/ vascular) testing. Due to room size and safety concerns, children are not allowed in the ultrasound rooms during exams. Our front office staff cannot provide observation of children in our lobby area while testing is being conducted. An adult accompanying a patient to their appointment will only be allowed in the ultrasound room at the discretion of the ultrasound technician under special circumstances. We apologize for any inconvenience.   Follow-Up: At [facility], you and your health needs are our priority.  As part of our continuing mission to provide you with exceptional heart care, our providers are all part of one team.  This team includes your primary Cardiologist (physician) and Advanced Practice Providers or APPs (Physician Assistants and Nurse Practitioners) who all work together to provide you with the care you need, when you need it.  Your next appointment:   3 month(s)  Provider:   Dorn Lesches, MD    We recommend signing up for the patient portal called MyChart.  Sign  up information is provided on this After Visit Summary.  MyChart is used to connect with patients for Virtual Visits (Telemedicine).  Patients are able to view lab/test results, encounter notes, upcoming appointments, etc.  Non-urgent messages can be sent to your provider as well.   To learn more about what you can do with MyChart, go to [email or web address].   Other Instructions   Your cardiac CT will be scheduled at the below location:    [facility] [address]  [place], [place] [address]    If scheduled at the Heart and Vascular Tower at [address], please enter the parking lot using the [address] entrance and use the FREE valet service at the patient drop-off area. Enter the building and check-in with registration on the main floor.   Please follow these instructions carefully (unless otherwise directed):  An IV will be required for this test and Nitroglycerin will be given.     On the Night Before the Test: Be sure to Drink plenty of water. Do not consume any caffeinated/decaffeinated beverages or chocolate 12 hours prior to your test. Do not take any antihistamines 12 hours prior to your test.   On the Day of the Test: Drink plenty of water until 1 hour prior to the test. Do not eat any food 1 hour prior to test. You may take your regular medications prior to the test.  Take metoprolol  (Lopressor ) 100mg  two hours prior to test. If you take Furosemide/Hydrochlorothiazide/Spironolactone/Chlorthalidone, please HOLD on the morning of the test. Patients who wear a continuous glucose monitor MUST remove the device prior to scanning. FEMALES- please wear underwire-free bra  if available, avoid dresses & tight clothing       After the Test: Drink plenty of water. After receiving IV contrast, you may experience a mild flushed feeling. This is normal. On occasion, you may experience a mild rash up to 24 hours after the test. This is  not dangerous. If this occurs, you can take Benadryl 25 mg, Zyrtec, Claritin, or Allegra and increase your fluid intake. (Patients taking Tikosyn should avoid Benadryl, and may take Zyrtec, Claritin, or Allegra) If you experience trouble breathing, this can be serious. If it is severe call 911 IMMEDIATELY. If it is mild, please call our office.  We will call to schedule your test 2-4 weeks out understanding that some insurance companies will need an authorization prior to the service being performed.   For more information and frequently asked questions, please visit our website : [email or web address]  For non-scheduling related questions, please contact the cardiac imaging nurse navigator should you have any questions/concerns: Cardiac Imaging Nurse Navigators Direct Office Dial: [phone]   For scheduling needs, including cancellations and rescheduling, please call [place], [phone].

## 2024-07-07 NOTE — Progress Notes (Signed)
 Vanessa Diaz returns today for follow-up of her recent heart cath performed by Dr. Darron via the right radial approach 06/24/2024 because of exertional chest pain.  She did have a coronary calcium  score of 343 performed 12/12/21.  The cardiac catheterization revealed an intermediate mid LAD lesion in the 50 to 60% range with some mild diagonal branch disease as well.  LVEDP was 24.  LV function was normal.  Since the heart cath she has had no recurrent symptoms.  I am going to get a 2D echo and a coronary CTA to determine physiologic significance.  At this point, I do not feel she needs to be on antianginal medications.  I will see her back in 3 months for follow-up.  Dorn DOROTHA Lesches, M.D., FACP, [facility], FAHA, [facility]  [address] [place], [place]  [address]  [phone] 07/07/2024 10:38 AM

## 2024-07-14 ENCOUNTER — Telehealth (HOSPITAL_COMMUNITY): Payer: Self-pay | Admitting: Emergency Medicine

## 2024-07-14 ENCOUNTER — Ambulatory Visit (HOSPITAL_COMMUNITY)
Admission: RE | Admit: 2024-07-14 | Discharge: 2024-07-14 | Disposition: A | Discharge status: Home / Self Care | Source: Ambulatory Visit | Attending: Cardiovascular Disease | Admitting: Cardiovascular Disease

## 2024-07-14 ENCOUNTER — Telehealth (HOSPITAL_COMMUNITY): Payer: Self-pay | Admitting: *Deleted

## 2024-07-14 DIAGNOSIS — R931 Abnormal findings on diagnostic imaging of heart and coronary circulation: Secondary | ICD-10-CM | POA: Diagnosis present

## 2024-07-14 DIAGNOSIS — E782 Mixed hyperlipidemia: Secondary | ICD-10-CM | POA: Diagnosis present

## 2024-07-14 DIAGNOSIS — I2510 Atherosclerotic heart disease of native coronary artery without angina pectoris: Secondary | ICD-10-CM | POA: Diagnosis present

## 2024-07-14 LAB — ECHOCARDIOGRAM COMPLETE
Area-P 1/2: 2.73 cm2
P 1/2 time: 638 ms
S' Lateral: 3.2 cm

## 2024-07-14 NOTE — Telephone Encounter (Signed)
 Patient returning call about her upcoming cardiac imaging study; pt verbalizes understanding of appt date/time, parking situation and where to check in, pre-test NPO status and medications ordered, and verified current allergies; name and call back number provided for further questions should they arise  Vanessa Requena RN Navigator Cardiac Imaging [facility] [phone] office [phone] cell

## 2024-07-14 NOTE — Telephone Encounter (Signed)
 Attempted to call patient regarding upcoming cardiac CT appointment. Left message on voicemail with name and callback number Rockwell Alexandria RN Navigator Cardiac Imaging [facility] [phone] Office [phone] Cell

## 2024-07-15 ENCOUNTER — Ambulatory Visit: Payer: Self-pay | Admitting: Cardiovascular Disease

## 2024-07-16 ENCOUNTER — Other Ambulatory Visit: Payer: Self-pay | Admitting: Internal Medicine

## 2024-07-16 ENCOUNTER — Ambulatory Visit (HOSPITAL_COMMUNITY)
Admission: RE | Admit: 2024-07-16 | Discharge: 2024-07-16 | Disposition: A | Discharge status: Home / Self Care | Source: Ambulatory Visit | Attending: Internal Medicine | Admitting: Internal Medicine

## 2024-07-16 ENCOUNTER — Ambulatory Visit (HOSPITAL_COMMUNITY)
Admission: RE | Admit: 2024-07-16 | Discharge: 2024-07-16 | Disposition: A | Discharge status: Home / Self Care | Source: Ambulatory Visit | Attending: Cardiovascular Disease | Admitting: Cardiovascular Disease

## 2024-07-16 DIAGNOSIS — R931 Abnormal findings on diagnostic imaging of heart and coronary circulation: Secondary | ICD-10-CM | POA: Insufficient documentation

## 2024-07-16 DIAGNOSIS — I2510 Atherosclerotic heart disease of native coronary artery without angina pectoris: Secondary | ICD-10-CM | POA: Insufficient documentation

## 2024-07-16 DIAGNOSIS — E782 Mixed hyperlipidemia: Secondary | ICD-10-CM | POA: Insufficient documentation

## 2024-07-16 MED ORDER — NITROGLYCERIN 0.4 MG SL SUBL
0.8000 mg | SUBLINGUAL_TABLET | Freq: Once | SUBLINGUAL | Status: AC
  Administered 2024-07-16: 0.8 mg via SUBLINGUAL

## 2024-07-16 MED ORDER — IOHEXOL 350 MG/ML SOLN
100.0000 mL | Freq: Once | INTRAVENOUS | Status: AC | PRN
  Administered 2024-07-16: 100 mL via INTRAVENOUS

## 2024-07-17 ENCOUNTER — Ambulatory Visit: Payer: Self-pay | Admitting: Cardiovascular Disease

## 2024-09-13 ENCOUNTER — Encounter: Payer: Self-pay | Admitting: Cardiovascular Disease

## 2024-09-13 NOTE — Telephone Encounter (Signed)
**Note De-identified  [place] Obfuscation** Please advise 

## 2024-09-22 ENCOUNTER — Encounter: Payer: Self-pay | Admitting: Cardiovascular Disease

## 2024-09-22 ENCOUNTER — Ambulatory Visit: Attending: Cardiovascular Disease | Admitting: Cardiovascular Disease

## 2024-09-22 VITALS — BP 123/68 | HR 59 | Ht 68.0 in | Wt 141.0 lb

## 2024-09-22 DIAGNOSIS — I2583 Coronary atherosclerosis due to lipid rich plaque: Secondary | ICD-10-CM | POA: Diagnosis not present

## 2024-09-22 DIAGNOSIS — I2510 Atherosclerotic heart disease of native coronary artery without angina pectoris: Secondary | ICD-10-CM | POA: Diagnosis not present

## 2024-09-22 NOTE — Progress Notes (Signed)
 Vanessa Diaz  returns today for follow-up.  I saw her in the office 3 months ago.  She does have a moderate mid LAD stenosis demonstrated at the time of cath 06/24/2024.  I obtained a coronary CTA 07/16/2024 revealing a coronary calcium  score of 405 with moderate mid LAD stenosis that was not physiologically significant.  She is active and has no symptoms.  She is at goal for secondary prevention with an LDL of 38.  Will continue to follow her medically as an outpatient.  Should she develop symptoms she would be fairly straightforward to intervene on.  Dorn Vanessa Diaz, M.D., Vanessa Diaz [facility], [place], [facility]  [address] [place], [place]  [address]  [phone] 09/22/2024 1:34 PM

## 2024-09-22 NOTE — Patient Instructions (Signed)
 Medication Instructions:  Your physician recommends that you continue on your current medications as directed. Please refer to the Current Medication list given to you today.  *If you need a refill on your cardiac medications before your next appointment, please call your pharmacy*   Follow-Up: At [facility], you and your health needs are our priority.  As part of our continuing mission to provide you with exceptional heart care, our providers are all part of one team.  This team includes your primary Cardiologist (physician) and Advanced Practice Providers or APPs (Physician Assistants and Nurse Practitioners) who all work together to provide you with the care you need, when you need it.  Your next appointment:   6 month(s)  Provider:   Dorn Lesches, MD

## 2024-11-02 ENCOUNTER — Telehealth: Payer: Self-pay | Admitting: Cardiovascular Disease

## 2024-11-02 ENCOUNTER — Encounter: Payer: Self-pay | Admitting: Cardiovascular Disease

## 2024-11-02 MED ORDER — NITROGLYCERIN 0.4 MG SL SUBL: 0.4000 mg | SUBLINGUAL_TABLET | SUBLINGUAL | 3 refills | Status: AC | PRN

## 2024-11-02 NOTE — Telephone Encounter (Signed)
 Spoke with RN in triage and was informed they will call the patient in about 5 minutes due to being on the phone with another patient.

## 2024-11-02 NOTE — Telephone Encounter (Signed)
 Pt returning call to nurse.

## 2024-11-02 NOTE — Telephone Encounter (Signed)
 Please see phone encounter for more info.

## 2024-11-02 NOTE — Telephone Encounter (Signed)
 Pt c/o of Chest Pain: STAT if active (IN THIS MOMENT) CP, including tightness, pressure, jaw pain, shoulder/upper arm/back pain, SOB, nausea, and vomiting.  1. Are you having CP right now (tightness, pressure, or discomfort)? Yes, started in the morning 2. Are you experiencing any other symptoms (ex. SOB, nausea, vomiting, sweating)? A little short of breath  3. How long have you been experiencing CP? Started this morning  4. Is your CP continuous or coming and going? Continuous, it's there  5. Have you taken Nitroglycerin ? No  6. If CP returns before callback, please consider calling 911. ?  Pt's current HR is 84 and is going down

## 2024-11-02 NOTE — Telephone Encounter (Signed)
 Spoke with pt regarding her waking up last with feeling of having an elephant on her chest. This is similar to the feeling that she had back in July but not quite at bad. Pt states her pain lasted all morning. Pt states that pain resolved after taking an additional dose of aspirin 81mg  and taking a warm bath. Blood pressure earlier was 153/82, heart rate 73bpm. Pt took her blood pressure again at 2:30pm, 139/72 heart rate 73bpm. Pt states it was just enough pain to be concerned. Spoke with DOD, Dr. Ren, verbal order received for nitroglycerin  SL 0.4mg  as needed for chest pain and Imdur 30mg  daily. Discussed this with the pt. She does not want to take Imdur at this time. She is agreeable to have SL nitroglycerin  on hand. We discussed when and how to take medication and new prescription sent in for pt. Pt will continue to monitor her symptoms. If symptoms start occurring more frequently she will let us  know.

## 2025-01-06 ENCOUNTER — Telehealth (HOSPITAL_BASED_OUTPATIENT_CLINIC_OR_DEPARTMENT_OTHER): Payer: Self-pay | Admitting: *Deleted

## 2025-01-06 NOTE — Telephone Encounter (Signed)
"  ° °  Pre-operative Risk Assessment    Patient Name: Vanessa Diaz  DOB: 10-02-1956 MRN: [id number]   Date of last office visit: 09/22/24 DR. BERRY Date of next office visit: 04/04/25 DR. BERRY    Request for Surgical Clearance    Procedure:  COLONOSCOPY  Date of Surgery:  Clearance 01/10/25                                Surgeon:  DR. KRISS Surgeon's Group or Practice Name:  [facility] Phone number:  [phone] Fax number:  [phone]   Type of Clearance Requested:   - Medical    Type of Anesthesia:  PROPOFOL   Additional requests/questions:    Bonney Niels Jest   01/06/2025, 4:40 PM   "

## 2025-04-04 ENCOUNTER — Ambulatory Visit: Admitting: Cardiovascular Disease
